# Patient Record
Sex: Male | Born: 1955 | Race: White | Hispanic: Refuse to answer | Marital: Single | Smoking: Never smoker
Health system: Southern US, Community
[De-identification: ages and names within clinical notes are randomized; demographics above are authoritative.]

## PROBLEM LIST (undated history)

## (undated) DIAGNOSIS — K56609 Unspecified intestinal obstruction, unspecified as to partial versus complete obstruction: Secondary | ICD-10-CM

## (undated) DIAGNOSIS — Z86711 Personal history of pulmonary embolism: Secondary | ICD-10-CM

## (undated) HISTORY — PX: TONSILLECTOMY: SUR1361

---

## 2008-06-07 DIAGNOSIS — M1990 Unspecified osteoarthritis, unspecified site: Secondary | ICD-10-CM | POA: Insufficient documentation

## 2010-06-07 HISTORY — PX: TOTAL KNEE ARTHROPLASTY: SHX125

## 2010-10-01 ENCOUNTER — Inpatient Hospital Stay: Payer: Self-pay | Admitting: Specialist

## 2010-10-01 ENCOUNTER — Ambulatory Visit: Payer: Self-pay | Admitting: General Practice

## 2017-05-27 ENCOUNTER — Other Ambulatory Visit: Payer: Self-pay

## 2017-05-27 ENCOUNTER — Ambulatory Visit: Payer: Medicaid Other | Attending: Physical Medicine and Rehabilitation | Admitting: Physical Therapy

## 2017-05-27 DIAGNOSIS — R29898 Other symptoms and signs involving the musculoskeletal system: Secondary | ICD-10-CM | POA: Diagnosis present

## 2017-05-27 DIAGNOSIS — R293 Abnormal posture: Secondary | ICD-10-CM

## 2017-05-27 DIAGNOSIS — G8929 Other chronic pain: Secondary | ICD-10-CM | POA: Diagnosis present

## 2017-05-27 DIAGNOSIS — M545 Low back pain: Secondary | ICD-10-CM | POA: Insufficient documentation

## 2017-05-27 NOTE — Patient Instructions (Signed)
Hamstring Step 2   Left foot relaxed, knee straight, other leg bent, foot flat. Raise straight leg further upward to maximal range. Hold _30__ seconds. Relax leg completely down. Repeat __3_ times.  Knee to Chest (Flexion)   Pull knee toward chest. Feel stretch in lower back or buttock area. Breathing deeply, Hold __15__ seconds. Repeat with other knee. Repeat _5___ times. Do __2__ sessions per day.  Bridging   Slowly raise buttocks from floor, keeping stomach tight. Repeat __15__ times per set. Do __2__ sets per session.   Lumbar Rotation (Non-Weight Bearing)   Feet on floor, slowly rock knees from side to side in small, pain-free range of motion. Allow lower back to rotate slightly. Repeat __15__ times per set. Do _2__ sets per session.  Strengthening: Straight Leg Raise (Phase 1)   Tighten muscles on front of right thigh, then lift leg __8__ inches from surface, keeping knee locked.  Repeat _15___ times per set.

## 2017-05-27 NOTE — Therapy (Addendum)
[facility] [address]  [place], [place], [address] Phone: [phone]   Fax:  [phone]  Physical Therapy Evaluation  Patient Details  Name: Ronnie Carey MRN: [id number] Date of Birth: Apr 20, 1956 Referring Provider: Dr. Suella Broad   Encounter Date: 05/27/2017  PT End of Session - 05/27/17 7425    Visit Number  1    Number of Visits  4    Date for PT Re-Evaluation  06/24/17    Authorization Type  Medicaid (submitted for approval)    PT Start Time  0833    PT Stop Time  0920    PT Time Calculation (min)  47 min    Activity Tolerance  Patient tolerated treatment well    Behavior During Therapy  [facility] for tasks assessed/performed        There were no vitals filed for this visit.   Subjective Assessment - 05/27/17 0834    Subjective  Patient reporting total body pain and he is "screwed up". reports a fall - 25 feet - when he was 36. 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 Spent 6 years in prison of which he reports caused pain. Now having sciatica of L leg only. Reports he had an xray and has no disc at L4-5; now has spasms of lower back. pain into L buttock and L leg. Takes 800 mg ibuprofen daily.     Pertinent History  R TKA    Diagnostic tests  xray - per patient - no disc at L4-5    Currently in Pain?  Yes    Pain Score  3     Pain Location  Back    Pain Orientation  Left    Pain Descriptors / Indicators  Shooting    Pain Type  Chronic pain    Pain Radiating Towards  into L buttock and L posterior L LE    Pain Onset  More than a month ago    Pain Frequency  Constant    Aggravating Factors   moving wrong, lifting, prolonged sitting and standing    Pain Relieving Factors  hanging/traction, stretching         [facility] PT Assessment - 05/27/17 0820      Assessment   Medical Diagnosis  DDD lumbar  disc; chronic pain syndrome    Referring Provider  Dr. Suella Broad    Next MD Visit  -- after PT evaluation    Prior Therapy  not for this issue      Precautions   Precautions  None      Restrictions   Weight Bearing Restrictions  No      Balance Screen   Has the patient fallen in the past 6 months  No    Has the patient had a decrease in activity level because of a fear of falling?   No    Is the patient reluctant to leave their home because of a fear of falling?   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      Cognition   Overall Cognitive Status  Within Functional Limits for tasks assessed      Observation/Other Assessments   Focus on Therapeutic Outcomes (FOTO)   --      Sensation   Light Touch  Appears Intact      Coordination   Gross Motor Movements are Fluid and Coordinated  Yes      Posture/Postural Control   Posture/Postural Control  Postural limitations    Postural Limitations  Rounded Shoulders;Forward head      ROM / Strength   AROM / PROM / Strength  AROM;Strength      AROM   AROM Assessment Site  Lumbar    Lumbar Flexion  fingertips to toes - reports cracking of spine    Lumbar Extension  25% limited - pain in L leg    Lumbar - Right Side Bend  fingertip to joint line - slight pain    Lumbar - Left Side Bend  fingertip above joint line - pain    Lumbar - Right Rotation  25% limited - slight pain    Lumbar - Left Rotation  40% limtied - painful      Strength   Overall Strength Comments  R LE gross strength 4+/5; L LE gross strength 4-/5    Strength Assessment Site  Hip;Knee      Flexibility   Soft Tissue Assessment /Muscle Length  yes    Hamstrings  B moderate tightness      Palpation   Palpation comment  TTP along lumbar paraspinals and L glute region              Objective measurements completed on examination: See above findings.              PT Education - 05/27/17 [phone]    Education provided  Yes    Education Details  exam findings, POC, HEP    Person(s) Educated  Patient    Methods  Explanation;Demonstration;Handout    Comprehension  Verbalized understanding;Returned demonstration          PT Long Term Goals - 05/27/17 0822      PT LONG TERM GOAL #1   Title  patient to be independent with advanced HEP    Status  New    Target Date  06/24/17      PT LONG TERM GOAL #2   Title  Patient to demonstrate full lumbar AROM to WNL without pain    Status  New    Target Date  06/24/17      PT LONG TERM GOAL #3   Title  Patient to demonstrate strength of L LE to >/= 4+/5    Status  New    Target Date  06/24/17      PT LONG TERM GOAL #4   Title  patient to demosntrate good posture and body mechanics as it relates to his daily activities    Status  New    Target Date  06/24/17             Plan - 05/27/17 1200    Clinical Impression Statement  Mr. Galentine is a 61 y/o male presenting to [facility] today regarding primary complaints of low back pain that has been present for quite some time. Patient with subjective reports of sciatica type symptoms, however unable to reproduce any of these signs in session today. Patient reporting prior x-ray of lumbar spine with possible "forward shift" of spine per patient, however not in medical chart. Patient today with B HS tightness, slight weakness  of L proximal hip as well as TTP alung lumbar paraspinals. HEP intitiated today with good carryover. Did discuss advising patient to follow up with MD prior to scheduling further visits to confirm/rule out skeletal instability prior to further treatment. Patient to benefit from skilled PT services to address pain and functional mobility limitations.     Clinical Presentation  Stable    Clinical Decision Making  Low    Rehab Potential   Good    PT Frequency  1x / week    PT Duration  3 weeks    PT Treatment/Interventions  ADLs/Self Care Home Management;Cryotherapy;Electrical Stimulation;Iontophoresis 75m/ml Dexamethasone;Moist Heat;Traction;Therapeutic exercise;Therapeutic activities;Functional mobility training;Ultrasound;Passive range of motion;Dry needling;Manual techniques;Vasopneumatic Device;Taping;Patient/family education;Neuromuscular re-education    Consulted and Agree with Plan of Care  Patient       Patient will benefit from skilled therapeutic intervention in order to improve the following deficits and impairments:  Abnormal gait, Pain, Decreased strength, Decreased activity tolerance, Difficulty walking  Visit Diagnosis: Chronic bilateral low back pain, with sciatica presence unspecified  Abnormal posture  Other symptoms and signs involving the musculoskeletal system     Problem List There are no active problems to display for this patient.    SLanney Gins PT, DPT 05/27/17 12:25 PM  PHYSICAL THERAPY DISCHARGE SUMMARY  Visits from Start of Care: 1  Current functional level related to goals / functional outcomes: See above; did not call back for scheduling   Remaining deficits: See above   Education / Equipment: HEP  Plan: Patient agrees to discharge.  Patient goals were not met. Patient is being discharged due to not returning since the last visit.  ?????    SLanney Gins PT, DPT 07/19/17 10:09 AM   [facility] [place] [address]Phone: [phone]  Fax:  [phone] Name: Ronnie TavanoMRN: [id number] of Birth: [birth year]

## 2018-05-07 DIAGNOSIS — K56609 Unspecified intestinal obstruction, unspecified as to partial versus complete obstruction: Secondary | ICD-10-CM

## 2018-05-07 HISTORY — DX: Unspecified intestinal obstruction, unspecified as to partial versus complete obstruction: K56.609

## 2018-05-09 ENCOUNTER — Inpatient Hospital Stay (HOSPITAL_COMMUNITY)
Admission: EM | Admit: 2018-05-09 | Discharge: 2018-05-12 | DRG: 390 | Disposition: A | Discharge status: Home / Self Care | Payer: Medicaid Other | Attending: General Surgery | Admitting: General Surgery

## 2018-05-09 ENCOUNTER — Encounter (HOSPITAL_COMMUNITY): Payer: Self-pay | Admitting: Emergency Medicine

## 2018-05-09 DIAGNOSIS — Z4659 Encounter for fitting and adjustment of other gastrointestinal appliance and device: Secondary | ICD-10-CM

## 2018-05-09 DIAGNOSIS — K56609 Unspecified intestinal obstruction, unspecified as to partial versus complete obstruction: Secondary | ICD-10-CM

## 2018-05-09 DIAGNOSIS — K429 Umbilical hernia without obstruction or gangrene: Secondary | ICD-10-CM | POA: Diagnosis present

## 2018-05-09 DIAGNOSIS — K56600 Partial intestinal obstruction, unspecified as to cause: Principal | ICD-10-CM | POA: Diagnosis present

## 2018-05-09 DIAGNOSIS — Z96659 Presence of unspecified artificial knee joint: Secondary | ICD-10-CM | POA: Diagnosis present

## 2018-05-09 HISTORY — DX: Personal history of pulmonary embolism: Z86.711

## 2018-05-09 HISTORY — DX: Unspecified intestinal obstruction, unspecified as to partial versus complete obstruction: K56.609

## 2018-05-09 LAB — CBC
HCT: 43 % (ref 39.0–52.0)
Hemoglobin: 14.5 g/dL (ref 13.0–17.0)
MCH: 30.5 pg (ref 26.0–34.0)
MCHC: 33.7 g/dL (ref 30.0–36.0)
MCV: 90.3 fL (ref 80.0–100.0)
Platelets: 320 10*3/uL (ref 150–400)
RBC: 4.76 MIL/uL (ref 4.22–5.81)
RDW: 12.4 % (ref 11.5–15.5)
WBC: 7.8 10*3/uL (ref 4.0–10.5)
nRBC: 0 % (ref 0.0–0.2)

## 2018-05-09 LAB — COMPREHENSIVE METABOLIC PANEL
ALT: 34 U/L (ref 0–44)
AST: 25 U/L (ref 15–41)
Albumin: 4.2 g/dL (ref 3.5–5.0)
Alkaline Phosphatase: 66 U/L (ref 38–126)
Anion gap: 11 (ref 5–15)
BUN: 13 mg/dL (ref 8–23)
CO2: 22 mmol/L (ref 22–32)
Calcium: 9.2 mg/dL (ref 8.9–10.3)
Chloride: 103 mmol/L (ref 98–111)
Creatinine, Ser: 1.1 mg/dL (ref 0.61–1.24)
GFR calc Af Amer: >60 mL/min (ref >60)
GFR calc non Af Amer: >60 mL/min (ref >60)
Glucose, Bld: 146 mg/dL — ABNORMAL HIGH (ref 70–99)
Potassium: 3.5 mmol/L (ref 3.5–5.1)
Sodium: 136 mmol/L (ref 135–145)
Total Bilirubin: 0.8 mg/dL (ref 0.3–1.2)
Total Protein: 7.2 g/dL (ref 6.5–8.1)

## 2018-05-09 LAB — URINALYSIS, ROUTINE W REFLEX MICROSCOPIC
Bilirubin Urine: NEGATIVE
Glucose, UA: NEGATIVE mg/dL
Hgb urine dipstick: NEGATIVE
Ketones, ur: 5 mg/dL — AB
Leukocytes, UA: NEGATIVE
Nitrite: NEGATIVE
Protein, ur: NEGATIVE mg/dL
Specific Gravity, Urine: 1.012 (ref 1.005–1.030)
pH: 5 (ref 5.0–8.0)

## 2018-05-09 LAB — LIPASE, BLOOD: Lipase: 28 U/L (ref 11–51)

## 2018-05-09 NOTE — ED Triage Notes (Signed)
Pt reports abdominal pain accompanied w/ swelling, bloating, gas, nausea and vomiting.

## 2018-05-10 ENCOUNTER — Inpatient Hospital Stay (HOSPITAL_COMMUNITY): Payer: Medicaid Other

## 2018-05-10 ENCOUNTER — Emergency Department (HOSPITAL_COMMUNITY): Payer: Medicaid Other

## 2018-05-10 ENCOUNTER — Encounter (HOSPITAL_COMMUNITY): Payer: Self-pay | Admitting: General Practice

## 2018-05-10 DIAGNOSIS — K56600 Partial intestinal obstruction, unspecified as to cause: Secondary | ICD-10-CM | POA: Diagnosis present

## 2018-05-10 DIAGNOSIS — K429 Umbilical hernia without obstruction or gangrene: Secondary | ICD-10-CM | POA: Diagnosis present

## 2018-05-10 DIAGNOSIS — Z96659 Presence of unspecified artificial knee joint: Secondary | ICD-10-CM | POA: Diagnosis present

## 2018-05-10 DIAGNOSIS — K56609 Unspecified intestinal obstruction, unspecified as to partial versus complete obstruction: Secondary | ICD-10-CM | POA: Diagnosis present

## 2018-05-10 LAB — CBC
HCT: 44.8 % (ref 39.0–52.0)
Hemoglobin: 14.9 g/dL (ref 13.0–17.0)
MCH: 30.2 pg (ref 26.0–34.0)
MCHC: 33.3 g/dL (ref 30.0–36.0)
MCV: 90.7 fL (ref 80.0–100.0)
Platelets: 336 10*3/uL (ref 150–400)
RBC: 4.94 MIL/uL (ref 4.22–5.81)
RDW: 12.6 % (ref 11.5–15.5)
WBC: 8.8 10*3/uL (ref 4.0–10.5)
nRBC: 0 % (ref 0.0–0.2)

## 2018-05-10 LAB — BASIC METABOLIC PANEL
Anion gap: 12 (ref 5–15)
BUN: 12 mg/dL (ref 8–23)
CO2: 24 mmol/L (ref 22–32)
Calcium: 9.2 mg/dL (ref 8.9–10.3)
Chloride: 101 mmol/L (ref 98–111)
Creatinine, Ser: 1.18 mg/dL (ref 0.61–1.24)
GFR calc Af Amer: >60 mL/min (ref >60)
GFR calc non Af Amer: >60 mL/min (ref >60)
Glucose, Bld: 116 mg/dL — ABNORMAL HIGH (ref 70–99)
Potassium: 3.6 mmol/L (ref 3.5–5.1)
Sodium: 137 mmol/L (ref 135–145)

## 2018-05-10 MED ORDER — ACETAMINOPHEN 325 MG PO TABS
650.0000 mg | ORAL_TABLET | Freq: Four times a day (QID) | ORAL | Status: DC | PRN
  Administered 2018-05-10: 650 mg via ORAL
  Filled 2018-05-10: qty 2

## 2018-05-10 MED ORDER — MENTHOL 3 MG MT LOZG
1.0000 | LOZENGE | OROMUCOSAL | Status: DC | PRN
  Administered 2018-05-10: 3 mg via ORAL
  Filled 2018-05-10: qty 9

## 2018-05-10 MED ORDER — LORAZEPAM 2 MG/ML IJ SOLN
1.0000 mg | Freq: Once | INTRAMUSCULAR | Status: AC
  Administered 2018-05-10: 1 mg via INTRAVENOUS
  Filled 2018-05-10: qty 1

## 2018-05-10 MED ORDER — ONDANSETRON 4 MG PO TBDP: 4.0000 mg | ORAL_TABLET | Freq: Four times a day (QID) | ORAL | Status: DC | PRN

## 2018-05-10 MED ORDER — ENOXAPARIN SODIUM 40 MG/0.4ML ~~LOC~~ SOLN
40.0000 mg | SUBCUTANEOUS | Status: DC
  Administered 2018-05-10: 40 mg via SUBCUTANEOUS
  Filled 2018-05-10 (×2): qty 0.4

## 2018-05-10 MED ORDER — SODIUM CHLORIDE 0.9 % IV SOLN
INTRAVENOUS | Status: DC
  Administered 2018-05-10 – 2018-05-12 (×4): via INTRAVENOUS

## 2018-05-10 MED ORDER — DIATRIZOATE MEGLUMINE & SODIUM 66-10 % PO SOLN
90.0000 mL | Freq: Once | ORAL | Status: AC
  Administered 2018-05-10: 90 mL via NASOGASTRIC
  Filled 2018-05-10: qty 90

## 2018-05-10 MED ORDER — ONDANSETRON HCL 4 MG/2ML IJ SOLN: 4.0000 mg | Freq: Four times a day (QID) | INTRAMUSCULAR | Status: DC | PRN

## 2018-05-10 MED ORDER — LIDOCAINE HCL URETHRAL/MUCOSAL 2 % EX GEL
1.0000 "application " | Freq: Once | CUTANEOUS | Status: AC
  Administered 2018-05-10: 1 via TOPICAL
  Filled 2018-05-10: qty 20

## 2018-05-10 MED ORDER — MORPHINE SULFATE (PF) 2 MG/ML IV SOLN
2.0000 mg | INTRAVENOUS | Status: DC | PRN
  Filled 2018-05-10: qty 1

## 2018-05-10 MED ORDER — ACETAMINOPHEN 650 MG RE SUPP: 650.0000 mg | Freq: Four times a day (QID) | RECTAL | Status: DC | PRN

## 2018-05-10 MED ORDER — HYDRALAZINE HCL 20 MG/ML IJ SOLN: 10.0000 mg | INTRAMUSCULAR | Status: DC | PRN

## 2018-05-10 MED ORDER — IOHEXOL 300 MG/ML  SOLN
100.0000 mL | Freq: Once | INTRAMUSCULAR | Status: AC | PRN
  Administered 2018-05-10: 100 mL via INTRAVENOUS

## 2018-05-10 NOTE — ED Notes (Signed)
Attempted report 

## 2018-05-10 NOTE — ED Provider Notes (Signed)
[facility] EMERGENCY DEPARTMENT Provider Note   CSN: [id number] Arrival date & time: 05/09/18  2132     History   Chief Complaint Chief Complaint  Patient presents with  . Abdominal Pain  . Emesis    HPI Ronnie Carey is a 62 y.o. male.  The history is provided by the patient and medical records.  Abdominal Pain   Associated symptoms include nausea and vomiting.  Emesis   Associated symptoms include abdominal pain.     62 y.o. M with hx of umbilical hernia for the past 15 years, presenting to the ED with abdominal pain.  Reports he noticed this start about 2 days ago.  States it was mild, mostly just a little nausea and discomfort around his hernia.  States he did notice that it seemed a little larger than normal and he had to "push it back in" several times which he does not generally have to do.  Yesterday he had worsening nausea had a few episodes of vomiting.  States appetite has been poor.  States his bowel movements have also been loose and very small in caliber which is abnormal for him.  Overall he feels bloated and distended.  He has been able to pass a little gas and belch a few times.  States his hernia has become increasingly more sore.  States he also has been having subjective fever and some chills at home.  He denies any urinary symptoms.  Is not tried any medications for his symptoms at home.  No prior abdominal surgeries.  History reviewed. No pertinent past medical history.  There are no active problems to display for this patient.   History reviewed. No pertinent surgical history.      Home Medications    Prior to Admission medications   Not on File    Family History No family history on file.  Social History Social History   Tobacco Use  . Smoking status: Not on file  Substance Use Topics  . Alcohol use: Not on file  . Drug use: Not on file     Allergies   Patient has no known allergies.   Review of Systems Review  of Systems  Gastrointestinal: Positive for abdominal pain, nausea and vomiting.  All other systems reviewed and are negative.    Physical Exam Updated Vital Signs BP 128/81   Pulse 70   Temp 98.3 F (36.8 C) (Oral)   Resp 16   Ht 5\' 11"  (1.803 m)   Wt 106.6 kg   SpO2 96%   BMI 32.78 kg/m   Physical Exam  Constitutional: He is oriented to person, place, and time. He appears well-developed and well-nourished.  HENT:  Head: Normocephalic and atraumatic.  Mouth/Throat: Oropharynx is clear and moist.  Eyes: Pupils are equal, round, and reactive to light. Conjunctivae and EOM are normal.  Neck: Normal range of motion.  Cardiovascular: Normal rate, regular rhythm and normal heart sounds.  Pulmonary/Chest: Effort normal and breath sounds normal.  Abdominal: Soft. He exhibits distension. Bowel sounds are decreased. There is tenderness. There is no rigidity and no guarding.  Well healed midline surgical incision; Umbilical hernia present, partially reducible, skin overlying appears mildly erythematous without cellulitic changes; abdomen appears somewhat distended, bowel sounds decreased  Musculoskeletal: Normal range of motion.  Neurological: He is alert and oriented to person, place, and time.  Skin: Skin is warm and dry.  Psychiatric: He has a normal mood and affect.  Nursing note and vitals reviewed.  ED Treatments / Results  Labs (all labs ordered are listed, but only abnormal results are displayed) Labs Reviewed  COMPREHENSIVE METABOLIC PANEL - Abnormal; Notable for the following components:      Result Value   Glucose, Bld 146 (*)    All other components within normal limits  URINALYSIS, ROUTINE W REFLEX MICROSCOPIC - Abnormal; Notable for the following components:   APPearance HAZY (*)    Ketones, ur 5 (*)    All other components within normal limits  LIPASE, BLOOD  CBC    EKG None  Radiology Ct Abdomen Pelvis W Contrast  Result Date: 05/10/2018 CLINICAL  DATA:  Abdominal pain and distension. Enlarging umbilical hernia. EXAM: CT ABDOMEN AND PELVIS WITH CONTRAST TECHNIQUE: Multidetector CT imaging of the abdomen and pelvis was performed using the standard protocol following bolus administration of intravenous contrast. CONTRAST:  100mL OMNIPAQUE IOHEXOL 300 MG/ML  SOLN COMPARISON:  None. FINDINGS: LOWER CHEST: Minimal RIGHT lung base atelectasis. Included heart size is normal. No pericardial effusion. HEPATOBILIARY: 18 mm benign-appearing cyst RIGHT lobe of the liver, otherwise unremarkable. Normal gallbladder. PANCREAS: Normal. SPLEEN: Normal. ADRENALS/URINARY TRACT: Kidneys are orthotopic, demonstrating symmetric enhancement. No nephrolithiasis, hydronephrosis or solid renal masses. 4.5 cm homogeneously hypodense benign-appearing cyst upper pole LEFT kidney. The unopacified ureters are normal in course and caliber. Delayed imaging through the kidneys demonstrates symmetric prompt contrast excretion within the proximal urinary collecting system. Urinary bladder is partially distended and unremarkable. Normal adrenal glands. STOMACH/BOWEL: Dilated small bowel to 4 cm with LEFT mid abdomen transition point, no discrete mass (axial image 48). Mild fluid distended stomach. The large bowel are normal in course and caliber without inflammatory changes. Mild colonic diverticulosis. Normal appendix. VASCULAR/LYMPHATIC: Aortoiliac vessels are normal in course and caliber. Mild calcific atherosclerosis. No lymphadenopathy by CT size criteria. REPRODUCTIVE: Normal. OTHER: Small volume intraperitoneal free fluid. Mild mesenteric edema. Small fat containing umbilical hernias. Moderate fat containing umbilical hernia with minimal free fluid. MUSCULOSKELETAL: Nonacute. Grade 1 L4-5 anterolisthesis with chronic bilateral L4 pars interarticularis defects. Severe L4-5 neural foraminal narrowing. IMPRESSION: 1. Early versus partial obstruction, transition point LEFT mid abdomen,  possibly from adhesions. 2. Moderate fat containing umbilical hernia with minimal free fluid, no bowel within hernia sac. Aortic Atherosclerosis (ICD10-I70.0). Electronically Signed   By: Awilda Metroourtnay  Bloomer M.D.   On: 05/10/2018 02:39    Procedures Procedures (including critical care time)  Medications Ordered in ED Medications - No data to display   Initial Impression / Assessment and Plan / ED Course  I have reviewed the triage vital signs and the nursing notes.  Pertinent labs & imaging results that were available during my care of the patient were reviewed by me and considered in my medical decision making (see chart for details).  62 year old male here with abdominal pain.  Reports this began about 2 days ago and has been worsening.  He does report some nausea and vomiting as well as decreased appetite.  Has also noticed that his umbilical hernia seems "larger" than normal.  Still remains reducible.  He is afebrile and nontoxic.  Abdomen does appear somewhat distended and has decreased bowel sounds.  Umbilical hernia is partially reducible here, some mild erythema of the overlying skin but no overt signs of strangulation.  Given symptoms, concern for complications with hernia versus SBO.  CT scan was obtained revealing small bowel obstruction with transition point in the left midabdomen.  Fat-containing umbilical hernia without complication.  NG tube to be placed.  General surgery consulted--  Dr. Dwain Sarna has evaluated in the ED and will admit for ongoing care.  Final Clinical Impressions(s) / ED Diagnoses   Final diagnoses:  SBO (small bowel obstruction) [facility])    ED Discharge Orders    None       Garlon Hatchet, PA-C 05/10/18 0450    Gilda Crease, MD 05/10/18 [phone]

## 2018-05-10 NOTE — ED Notes (Signed)
Report called  

## 2018-05-10 NOTE — H&P (Signed)
Ronnie Carey is an 62 y.o. male.   Chief Complaint: abd pain HPI: [age] yom who is otherwise healthy presents with 48 hours of increasing abdominal pain, n/v.  This was not getting better and came to er.  He has never had before. Has known uh for years that is unchanged. No prior abdominal surgery. No history csc.  Is passing some flatus but not able to tolerate any po for 2 days.  He underwent ct scan that shows a fat containing uh and likely sbo.  The small bowel is dilated to 4 cm and there is small amount free fluid.  I was asked to see him   History reviewed. No pertinent past medical history.  Knee arthroplasty, t/a  Nonsmoker  Allergies: No Known Allergies  meds prn ibuprofen  Results for orders placed or performed during the hospital encounter of 05/09/18 (from the past 48 hour(s))  Urinalysis, Routine w reflex microscopic     Status: Abnormal   Collection Time: 05/09/18 10:25 PM  Result Value Ref Range   Color, Urine YELLOW YELLOW   APPearance HAZY (A) CLEAR   Specific Gravity, Urine 1.012 1.005 - 1.030   pH 5.0 5.0 - 8.0   Glucose, UA NEGATIVE NEGATIVE mg/dL   Hgb urine dipstick NEGATIVE NEGATIVE   Bilirubin Urine NEGATIVE NEGATIVE   Ketones, ur 5 (A) NEGATIVE mg/dL   Protein, ur NEGATIVE NEGATIVE mg/dL   Nitrite NEGATIVE NEGATIVE   Leukocytes, UA NEGATIVE NEGATIVE    Comment: Performed at [facility], [address] [place], [place] [address]  Lipase, blood     Status: None   Collection Time: 05/09/18 10:31 PM  Result Value Ref Range   Lipase 28 11 - 51 U/L    Comment: Performed at [facility], [address] [place], [place] [address]  Comprehensive metabolic panel     Status: Abnormal   Collection Time: 05/09/18 10:31 PM  Result Value Ref Range   Sodium 136 135 - 145 mmol/L   Potassium 3.5 3.5 - 5.1 mmol/L   Chloride 103 98 - 111 mmol/L   CO2 22 22 - 32 mmol/L   Glucose, Bld 146 (H) 70 - 99 mg/dL   BUN 13 8 - 23 mg/dL   Creatinine, Ser 6.641.10 0.61  - 1.24 mg/dL   Calcium 9.2 8.9 - 40.310.3 mg/dL   Total Protein 7.2 6.5 - 8.1 g/dL   Albumin 4.2 3.5 - 5.0 g/dL   AST 25 15 - 41 U/L   ALT 34 0 - 44 U/L   Alkaline Phosphatase 66 38 - 126 U/L   Total Bilirubin 0.8 0.3 - 1.2 mg/dL   GFR calc non Af Amer >60 >60 mL/min   GFR calc Af Amer >60 >60 mL/min   Anion gap 11 5 - 15    Comment: Performed at [facility], [address] [place], [place] [address]  CBC     Status: None   Collection Time: 05/09/18 10:31 PM  Result Value Ref Range   WBC 7.8 4.0 - 10.5 K/uL   RBC 4.76 4.22 - 5.81 MIL/uL   Hemoglobin 14.5 13.0 - 17.0 g/dL   HCT 95.643.0 38.739.0 - 56.452.0 %   MCV 90.3 80.0 - 100.0 fL   MCH 30.5 26.0 - 34.0 pg   MCHC 33.7 30.0 - 36.0 g/dL   RDW 33.212.4 95.111.5 - 88.415.5 %   Platelets 320 150 - 400 K/uL   nRBC 0.0 0.0 - 0.2 %  Comment: Performed at [facility], [address] [place], [place] [address]   Ct Abdomen Pelvis W Contrast  Result Date: 05/10/2018 CLINICAL DATA:  Abdominal pain and distension. Enlarging umbilical hernia. EXAM: CT ABDOMEN AND PELVIS WITH CONTRAST TECHNIQUE: Multidetector CT imaging of the abdomen and pelvis was performed using the standard protocol following bolus administration of intravenous contrast. CONTRAST:  OMNIPAQUE IOHEXOL 300 MG/ML  SOLN COMPARISON:  None. FINDINGS: LOWER CHEST: Minimal RIGHT lung base atelectasis. Included heart size is normal. No pericardial effusion. HEPATOBILIARY: 18 mm benign-appearing cyst RIGHT lobe of the liver, otherwise unremarkable. Normal gallbladder. PANCREAS: Normal. SPLEEN: Normal. ADRENALS/URINARY TRACT: Kidneys are orthotopic, demonstrating symmetric enhancement. No nephrolithiasis, hydronephrosis or solid renal masses. 4.5 cm homogeneously hypodense benign-appearing cyst upper pole LEFT kidney. The unopacified ureters are normal in course and caliber. Delayed imaging through the kidneys demonstrates symmetric prompt contrast excretion within the proximal urinary  collecting system. Urinary bladder is partially distended and unremarkable. Normal adrenal glands. STOMACH/BOWEL: Dilated small bowel to 4 cm with LEFT mid abdomen transition point, no discrete mass (axial image 48). Mild fluid distended stomach. The large bowel are normal in course and caliber without inflammatory changes. Mild colonic diverticulosis. Normal appendix. VASCULAR/LYMPHATIC: Aortoiliac vessels are normal in course and caliber. Mild calcific atherosclerosis. No lymphadenopathy by CT size criteria. REPRODUCTIVE: Normal. OTHER: Small volume intraperitoneal free fluid. Mild mesenteric edema. Small fat containing umbilical hernias. Moderate fat containing umbilical hernia with minimal free fluid. MUSCULOSKELETAL: Nonacute. Grade 1 L4-5 anterolisthesis with chronic bilateral L4 pars interarticularis defects. Severe L4-5 neural foraminal narrowing. IMPRESSION: 1. Early versus partial obstruction, transition point LEFT mid abdomen, possibly from adhesions. 2. Moderate fat containing umbilical hernia with minimal free fluid, no bowel within hernia sac. Aortic Atherosclerosis (ICD10-I70.0). Electronically Signed   By: Awilda Metro M.D.   On: 05/10/2018 02:39    Review of Systems  Gastrointestinal: Positive for abdominal pain, nausea and vomiting.  Musculoskeletal: Positive for joint pain.    Blood pressure 114/77, pulse 76, temperature 98.3 F (36.8 C), temperature source Oral, resp. rate 16, height 5\' 11"  (1.803 m), weight 106.6 kg, SpO2 96 %. Physical Exam  Vitals reviewed. Constitutional: He appears well-developed and well-nourished.  HENT:  Head: Normocephalic and atraumatic.  Right Ear: External ear normal.  Left Ear: External ear normal.  Eyes: Pupils are equal, round, and reactive to light. No scleral icterus.  Neck: Neck supple.  Cardiovascular: Normal rate, regular rhythm and normal heart sounds.  Respiratory: Effort normal and breath sounds normal. He has no wheezes.  GI: He  exhibits distension (mild). Bowel sounds are decreased. There is no tenderness. A hernia (uh tender) is present.     Assessment/Plan SBO I dont think this is related to uh. This appears to be just fat containing and he states it is at baseline although a little more tender due to multiple attempts to reduce it.  He does not need surgery at this time based on exam.  However without prior surgery he certainly has lower threshold for surgery. He feels a little better after ng and has already drained one cannister.  I think reasonable to give course of nonoperative mgt with ng drainage and small bowel protocol. We discussed indications for surgery as well.   Emelia Loron, MD 05/10/2018, 4:19 AM

## 2018-05-10 NOTE — Progress Notes (Signed)
Pt stated he is feeling better. He is having flatus. Abdominal pain and bloating is improved. He states his pain is more of a soreness.   PE:  General: Well appearing, NAD HEENT: NGT in place Abdomen: soft, not distended, + BS, mild generalized TTP without guarding, no peritonitis  SBO: - 8 hr delay film later today. Hopefully pt will not need surgery  Mattie MarlinJessica Colbie Danner, [facility] Pager [phone]

## 2018-05-11 ENCOUNTER — Inpatient Hospital Stay (HOSPITAL_COMMUNITY): Payer: Medicaid Other

## 2018-05-11 ENCOUNTER — Encounter (HOSPITAL_COMMUNITY): Payer: Self-pay | Admitting: General Practice

## 2018-05-11 ENCOUNTER — Other Ambulatory Visit: Payer: Self-pay

## 2018-05-11 NOTE — Progress Notes (Signed)
[facility] Progress Note     Subjective: CC: sbo Wants NGT out and wants to eat. Passing flatus, no BM yet. Denies pain. Distention improved.   Objective: Vital signs in last 24 hours: Temp:  [98 F (36.7 C)-98.7 F (37.1 C)] 98.2 F (36.8 C) (12/05 0554) Pulse Rate:  [66-75] 66 (12/05 0554) Resp:  [18-19] 18 (12/05 0554) BP: (115-134)/(65-75) 126/65 (12/05 0554) SpO2:  [96 %-97 %] 96 % (12/05 0554) Last BM Date: 05/08/18  Intake/Output from previous day: 12/04 0701 - 12/05 0700 In: 1839.9 [P.O.:120; I.V.:1719.9] Out: 1550 [Urine:300; Emesis/NG output:1250] Intake/Output this shift: Total I/O In: -  Out: 300 [Urine:300]  PE: Gen:  Alert, NAD, pleasant Card:  Regular rate and rhythm, pedal pulses 2+ BL Pulm:  Normal effort, clear to auscultation bilaterally Abd: Soft, non-tender, non-distended, bowel sounds present, no HSM Skin: warm and dry, no rashes  Psych: A&Ox3   Lab Results:  Recent Labs    05/09/18 2231 05/10/18 0537  WBC 7.8 8.8  HGB 14.5 14.9  HCT 43.0 44.8  PLT 320 336   BMET Recent Labs    05/09/18 2231 05/10/18 0537  NA 136 137  K 3.5 3.6  CL 103 101  CO2 22 24  GLUCOSE 146* 116*  BUN 13 12  CREATININE 1.10 1.18  CALCIUM 9.2 9.2   PT/INR No results for input(s): LABPROT, INR in the last 72 hours. CMP     Component Value Date/Time   NA 137 05/10/2018 0537   K 3.6 05/10/2018 0537   CL 101 05/10/2018 0537   CO2 24 05/10/2018 0537   GLUCOSE 116 (H) 05/10/2018 0537   BUN 12 05/10/2018 0537   CREATININE 1.18 05/10/2018 0537   CALCIUM 9.2 05/10/2018 0537   PROT 7.2 05/09/2018 2231   ALBUMIN 4.2 05/09/2018 2231   AST 25 05/09/2018 2231   ALT 34 05/09/2018 2231   ALKPHOS 66 05/09/2018 2231   BILITOT 0.8 05/09/2018 2231   GFRNONAA >60 05/10/2018 0537   GFRAA >60 05/10/2018 0537   Lipase     Component Value Date/Time   LIPASE 28 05/09/2018 2231       Studies/Results: Ct Abdomen Pelvis W Contrast  Result Date:  05/10/2018 CLINICAL DATA:  Abdominal pain and distension. Enlarging umbilical hernia. EXAM: CT ABDOMEN AND PELVIS WITH CONTRAST TECHNIQUE: Multidetector CT imaging of the abdomen and pelvis was performed using the standard protocol following bolus administration of intravenous contrast. CONTRAST:  100mL OMNIPAQUE IOHEXOL 300 MG/ML  SOLN COMPARISON:  None. FINDINGS: LOWER CHEST: Minimal RIGHT lung base atelectasis. Included heart size is normal. No pericardial effusion. HEPATOBILIARY: 18 mm benign-appearing cyst RIGHT lobe of the liver, otherwise unremarkable. Normal gallbladder. PANCREAS: Normal. SPLEEN: Normal. ADRENALS/URINARY TRACT: Kidneys are orthotopic, demonstrating symmetric enhancement. No nephrolithiasis, hydronephrosis or solid renal masses. 4.5 cm homogeneously hypodense benign-appearing cyst upper pole LEFT kidney. The unopacified ureters are normal in course and caliber. Delayed imaging through the kidneys demonstrates symmetric prompt contrast excretion within the proximal urinary collecting system. Urinary bladder is partially distended and unremarkable. Normal adrenal glands. STOMACH/BOWEL: Dilated small bowel to 4 cm with LEFT mid abdomen transition point, no discrete mass (axial image 48). Mild fluid distended stomach. The large bowel are normal in course and caliber without inflammatory changes. Mild colonic diverticulosis. Normal appendix. VASCULAR/LYMPHATIC: Aortoiliac vessels are normal in course and caliber. Mild calcific atherosclerosis. No lymphadenopathy by CT size criteria. REPRODUCTIVE: Normal. OTHER: Small volume intraperitoneal free fluid. Mild mesenteric edema. Small fat containing umbilical hernias.  Moderate fat containing umbilical hernia with minimal free fluid. MUSCULOSKELETAL: Nonacute. Grade 1 L4-5 anterolisthesis with chronic bilateral L4 pars interarticularis defects. Severe L4-5 neural foraminal narrowing. IMPRESSION: 1. Early versus partial obstruction, transition point LEFT  mid abdomen, possibly from adhesions. 2. Moderate fat containing umbilical hernia with minimal free fluid, no bowel within hernia sac. Aortic Atherosclerosis (ICD10-I70.0). Electronically Signed   By: Awilda Metro M.D.   On: 05/10/2018 02:39   Dg Abd Portable 1v-small Bowel Obstruction Protocol-initial, 8 Hr Delay  Result Date: 05/10/2018 CLINICAL DATA:  Small bowel obstruction, 8hr film. EXAM: PORTABLE ABDOMEN - 1 VIEW COMPARISON:  CT abdomen from earlier same day. FINDINGS: Dilated gas-filled bowel loops are again seen within the and central abdomen. Oral contrast is seen within the small bowel of the RIGHT abdomen, within distended and relatively decompressed loops suggesting partial obstruction. Contrast within the bladder. No evidence of abnormal fluid collection. No evidence of free intraperitoneal air, although the upper portions of the abdomen are excluded on this exam. IMPRESSION: Findings consistent with partial small bowel obstruction. Recommend additional follow-up plain film examination to ensure passage of oral contrast to the colon. Electronically Signed   By: Bary Richard M.D.   On: 05/10/2018 13:15   Dg Abd Portable 1v  Result Date: 05/10/2018 CLINICAL DATA:  Nasogastric tube placement. Small bowel obstruction. EXAM: PORTABLE ABDOMEN - 1 VIEW COMPARISON:  CT abdomen and pelvis May 10, 2018 FINDINGS: Nasogastric tube tip projects in mid stomach, side port projecting distal to GE junction. Contrast in the urinary collecting system. Multiple loops of mild gas distended small bowel. Soft tissue planes and included osseous structures are unchanged. IMPRESSION: Nasogastric tip projecting in mid stomach. Electronically Signed   By: Awilda Metro M.D.   On: 05/10/2018 04:41    Anti-infectives: Anti-infectives (From admission, onward)   None       Assessment/Plan SBO - repeat film with contrast in colon - d/c NGT and start CLD - may be able to advance later today -  mobilize as tolerated    LOS: 1 day    Wells Guiles , [facility] 05/11/2018, 11:04 AM Pager: [phone] Consults: [phone] Mon-Fri 7:00 am-4:30 pm Sat-Sun 7:00 am-11:30 am

## 2018-05-12 MED ORDER — DOCUSATE SODIUM 100 MG PO CAPS: 100.0000 mg | ORAL_CAPSULE | Freq: Two times a day (BID) | ORAL | 0 refills | Status: DC

## 2018-05-12 MED ORDER — DOCUSATE SODIUM 100 MG PO CAPS: 100.0000 mg | ORAL_CAPSULE | Freq: Two times a day (BID) | ORAL | Status: DC

## 2018-05-12 MED ORDER — POLYETHYLENE GLYCOL 3350 17 G PO PACK: 17.0000 g | PACK | Freq: Every day | ORAL | 0 refills | Status: DC | PRN

## 2018-05-12 MED ORDER — POLYETHYLENE GLYCOL 3350 17 G PO PACK: 17.0000 g | PACK | Freq: Every day | ORAL | Status: DC

## 2018-05-12 NOTE — Discharge Summary (Signed)
[facility] Discharge Summary   Patient ID: Ronnie Carey MRN: [id number] DOB/AGE: 01/28/1956 62 y.o.  Admit date: 05/09/2018 Discharge date: 05/12/2018  Admitting Diagnosis: SBO  Discharge Diagnosis Patient Active Problem List   Diagnosis Date Noted  . SBO (small bowel obstruction) (HCC) 05/10/2018    Consultants None  Imaging: Dg Abd Portable 1v  Result Date: 05/11/2018 CLINICAL DATA:  Small-bowel obstruction. EXAM: PORTABLE ABDOMEN - 1 VIEW COMPARISON:  05/10/2018. FINDINGS: NG tube noted with tip in the distal stomach/proximal duodenum. Multiple dilated loops of small bowel again noted. Contrast noted throughout the colon. Findings again consistent with partial small bowel obstruction. Contrast in the bladder. Degenerative change lumbar spine and both hips. IMPRESSION: NG tube noted with tip over the distal stomach/proximal duodenum. 2. Persistent dilated loops of small bowel. Contrast in the colon. Findings again consistent with partial small bowel obstruction. Electronically Signed   By: Maisie Fus  Register   On: 05/11/2018 12:30   Dg Abd Portable 1v-small Bowel Obstruction Protocol-initial, 8 Hr Delay  Result Date: 05/10/2018 CLINICAL DATA:  Small bowel obstruction, 8hr film. EXAM: PORTABLE ABDOMEN - 1 VIEW COMPARISON:  CT abdomen from earlier same day. FINDINGS: Dilated gas-filled bowel loops are again seen within the and central abdomen. Oral contrast is seen within the small bowel of the RIGHT abdomen, within distended and relatively decompressed loops suggesting partial obstruction. Contrast within the bladder. No evidence of abnormal fluid collection. No evidence of free intraperitoneal air, although the upper portions of the abdomen are excluded on this exam. IMPRESSION: Findings consistent with partial small bowel obstruction. Recommend additional follow-up plain film examination to ensure passage of oral contrast to the colon. Electronically Signed   By: Bary Richard M.D.   On: 05/10/2018 13:15    Procedures None  Hospital Course:  Patient is a 62 year old male who presented to [facility] with abdominal pain and distention.  Workup showed SBO.  Patient was admitted and started on the small bowel protocol. Improved with this and diet was advanced as tolerated.  On 05/12/18, the patient was voiding well, tolerating diet, ambulating well, pain improved, vital signs stable and felt stable for discharge home.    Physical Exam: Gen:  Alert, NAD, pleasant Card:  Regular rate and rhythm, pedal pulses 2+ BL Pulm:  Normal effort, clear to auscultation bilaterally Abd: Soft, non-tender, non-distended, bowel sounds present, no HSM Skin: warm and dry, no rashes  Psych: A&Ox3   Allergies as of 05/12/2018   No Known Allergies     Medication List    TAKE these medications   docusate sodium 100 MG capsule Commonly known as:  COLACE Take 1 capsule (100 mg total) by mouth 2 (two) times daily.   ibuprofen 200 MG tablet Commonly known as:  ADVIL,MOTRIN Take 200 mg by mouth every 6 (six) hours as needed for mild pain.   omeprazole 20 MG tablet Commonly known as:  PRILOSEC OTC Take 20 mg by mouth daily as needed (acid reflux).   polyethylene glycol packet Commonly known as:  MIRALAX / GLYCOLAX Take 17 g by mouth daily as needed for mild constipation.        Follow-up Information    [facility].   Specialty:  General Surgery Why:  Follow up as needed to discuss elective repair of umbilical hernia.  Contact information: [address] [place] [address] [phone]        Norm Salt, PA Follow up.  Specialty:  Physician Assistant Why:  Follow up as needed.  Contact information: [address] [place] [address] [phone]           Signed: Wells GuilesKelly Rayburn, [facility] 05/12/2018, 7:53 AM Pager: [phone] Consults: [phone] Mon-Fri 7:00 am-4:30  pm Sat-Sun 7:00 am-11:30 am

## 2018-05-12 NOTE — Plan of Care (Signed)

## 2018-05-12 NOTE — Progress Notes (Signed)
Ronnie PintoKristen Landino to be D/C'd  per MD order. Discussed with the patient and all questions fully answered.  VSS, Skin clean, dry and intact without evidence of skin break down, no evidence of skin tears noted.  IV catheter discontinued intact. Site without signs and symptoms of complications. Dressing and pressure applied.  An After Visit Summary was printed and given to the patient. Patient received prescription.  D/c education completed with patient/family including follow up instructions, medication list, d/c activities limitations if indicated, with other d/c instructions as indicated by MD - patient able to verbalize understanding, all questions fully answered.   Patient instructed to return to ED, call 911, or call MD for any changes in condition.   Patient to be escorted via WC, and D/C home via private auto.

## 2018-05-12 NOTE — Discharge Instructions (Signed)
Small Bowel Obstruction °A small bowel obstruction means that something is blocking the small bowel. The small bowel is also called the small intestine. It is the long tube that connects the stomach to the colon. An obstruction will stop food and fluids from passing through the small bowel. Treatment depends on what is causing the problem and how bad the problem is. °Follow these instructions at home: °· Get a lot of rest. °· Follow your diet as told by your doctor. You may need to: °? Only drink clear liquids until you start to get better. °? Avoid solid foods as told by your doctor. °· Take over-the-counter and prescription medicines only as told by your doctor. °· Keep all follow-up visits as told by your doctor. This is important. °Contact a doctor if: °· You have a fever. °· You have chills. °Get help right away if: °· You have pain or cramps that get worse. °· You throw up (vomit) blood. °· You have a feeling of being sick to your stomach (nausea) that does not go away. °· You cannot stop throwing up. °· You cannot drink fluids. °· You feel confused. °· You feel dry or thirsty (dehydrated). °· Your belly gets more bloated. °· You feel weak or you pass out (faint). °This information is not intended to replace advice given to you by your health care provider. Make sure you discuss any questions you have with your health care provider. °Document Released: 07/01/2004 Document Revised: 01/19/2016 Document Reviewed: 07/18/2014 °Elsevier Interactive Patient Education © 2018 Elsevier Inc. ° °

## 2018-07-17 ENCOUNTER — Encounter: Payer: Self-pay | Admitting: Family Medicine

## 2018-07-17 ENCOUNTER — Ambulatory Visit: Payer: PRIVATE HEALTH INSURANCE | Attending: Family Medicine | Admitting: Family Medicine

## 2018-07-17 VITALS — BP 118/76 | HR 74 | Temp 98.6°F | Resp 18 | Ht 71.0 in | Wt 239.0 lb

## 2018-07-17 DIAGNOSIS — K429 Umbilical hernia without obstruction or gangrene: Secondary | ICD-10-CM

## 2018-07-17 DIAGNOSIS — Z96651 Presence of right artificial knee joint: Secondary | ICD-10-CM | POA: Insufficient documentation

## 2018-07-17 DIAGNOSIS — M172 Bilateral post-traumatic osteoarthritis of knee: Secondary | ICD-10-CM

## 2018-07-17 MED ORDER — IBUPROFEN 600 MG PO TABS: 600.0000 mg | ORAL_TABLET | Freq: Three times a day (TID) | ORAL | 3 refills | Status: DC | PRN

## 2018-07-17 NOTE — Patient Instructions (Signed)
Hernia, Adult ° °  ° °A hernia happens when tissue inside your body pushes out through a weak spot in your belly muscles (abdominal wall). This makes a round lump (bulge). The lump may be: °· In a scar from surgery that was done in your belly (incisional hernia). °· Near your belly button (umbilical hernia). °· In your groin (inguinal hernia). Your groin is the area where your leg meets your lower belly (abdomen). This kind of hernia could also be: °? In your scrotum, if you are male. °? In folds of skin around your vagina, if you are male. °· In your upper thigh (femoral hernia). °· Inside your belly (hiatal hernia). This happens when your stomach slides above the muscle between your belly and your chest (diaphragm). °If your hernia is small and it does not cause pain, you may not need treatment. If your hernia is large or it causes pain, you may need surgery. °Follow these instructions at home: °Activity °· Avoid stretching or overusing (straining) the muscles near your hernia. Straining can happen when you: °? Lift something heavy. °? Poop (have a bowel movement). °· Do not lift anything that is heavier than 10 lb (4.5 kg), or the limit that you are told, until your doctor says that it is safe. °· Use the strength of your legs when you lift something heavy. Do not use only your back muscles to lift. °General instructions °· Do these things if told by your doctor so you do not have trouble pooping (constipation): °? Drink enough fluid to keep your pee (urine) pale yellow. °? Eat foods that are high in fiber. These include fresh fruits and vegetables, whole grains, and beans. °? Limit foods that are high in fat and processed sugars. These include foods that are fried or sweet. °? Take medicine for trouble pooping. °· When you cough, try to cough gently. °· You may try to push your hernia in by very gently pressing on it when you are lying down. Do not try to force the bulge back in if it will not push in  easily. °· If you are overweight, work with your doctor to lose weight safely. °· Do not use any products that have nicotine or tobacco in them. These include cigarettes and e-cigarettes. If you need help quitting, ask your doctor. °· If you will be having surgery (hernia repair), watch your hernia for changes in shape, size, or color. Tell your doctor if you see any changes. °· Take over-the-counter and prescription medicines only as told by your doctor. °· Keep all follow-up visits as told by your doctor. °Contact a doctor if: °· You get new pain, swelling, or redness near your hernia. °· You poop fewer times in a week than normal. °· You have trouble pooping. °· You have poop (stool) that is more dry than normal. °· You have poop that is harder or larger than normal. °Get help right away if: °· You have a fever. °· You have belly pain that gets worse. °· You feel sick to your stomach (nauseous). °· You throw up (vomit). °· Your hernia cannot be pushed in by very gently pressing on it when you are lying down. Do not try to force the bulge back in if it will not push in easily. °· Your hernia: °? Changes in shape or size. °? Changes color. °? Feels hard or it hurts when you touch it. °These symptoms may represent a serious problem that is an emergency. Do not   wait to see if the symptoms will go away. Get medical help right away. Call your local emergency services (911 in the [place] °Summary °· A hernia happens when tissue inside your body pushes out through a weak spot in the belly muscles. This creates a bulge. °· If your hernia is small and it does not hurt, you may not need treatment. If your hernia is large or it hurts, you may need surgery. °· If you will be having surgery, watch your hernia for changes in shape, size, or color. Tell your doctor about any changes. °This information is not intended to replace advice given to you by your health care provider. Make sure you discuss any questions you have with  your health care provider. °Document Released: 11/11/2009 Document Revised: 02/23/2017 Document Reviewed: 02/23/2017 °Elsevier Interactive Patient Education © 2019 Elsevier Inc. ° °
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
## 2018-07-19 ENCOUNTER — Ambulatory Visit (INDEPENDENT_AMBULATORY_CARE_PROVIDER_SITE_OTHER): Payer: PRIVATE HEALTH INSURANCE | Admitting: Orthopaedic Surgery

## 2018-07-19 ENCOUNTER — Ambulatory Visit (INDEPENDENT_AMBULATORY_CARE_PROVIDER_SITE_OTHER): Payer: PRIVATE HEALTH INSURANCE

## 2018-07-19 ENCOUNTER — Encounter (INDEPENDENT_AMBULATORY_CARE_PROVIDER_SITE_OTHER): Payer: Self-pay | Admitting: Orthopaedic Surgery

## 2018-07-19 DIAGNOSIS — Z96651 Presence of right artificial knee joint: Secondary | ICD-10-CM

## 2018-07-19 DIAGNOSIS — G8929 Other chronic pain: Secondary | ICD-10-CM

## 2018-07-19 DIAGNOSIS — M25561 Pain in right knee: Secondary | ICD-10-CM

## 2018-07-19 DIAGNOSIS — M1712 Unilateral primary osteoarthritis, left knee: Secondary | ICD-10-CM | POA: Diagnosis not present

## 2018-07-19 DIAGNOSIS — M25562 Pain in left knee: Secondary | ICD-10-CM
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
## 2018-07-28 ENCOUNTER — Telehealth (INDEPENDENT_AMBULATORY_CARE_PROVIDER_SITE_OTHER): Payer: Self-pay | Admitting: *Deleted

## 2018-07-28 NOTE — Telephone Encounter (Signed)
Received call from [place] with [employer] stating [facility] is out of network with this insurance and wants to know if can get her scheduled with an in network facility. I called Lanora Manis and left vm to return my call.

## 2018-07-31 ENCOUNTER — Telehealth: Payer: Self-pay | Admitting: Physician Assistant

## 2018-07-31 ENCOUNTER — Encounter (INDEPENDENT_AMBULATORY_CARE_PROVIDER_SITE_OTHER): Payer: Self-pay | Admitting: Orthopaedic Surgery

## 2018-07-31 ENCOUNTER — Other Ambulatory Visit: Payer: PRIVATE HEALTH INSURANCE

## 2018-07-31 NOTE — Telephone Encounter (Signed)
Patient called to update that [place] is not within their network. Please follow up.

## 2018-07-31 NOTE — Telephone Encounter (Signed)
I spoke to patient and he said that [facility] Is in the network . I will forwarder the referral to   [facility]

## 2018-08-01 ENCOUNTER — Telehealth (INDEPENDENT_AMBULATORY_CARE_PROVIDER_SITE_OTHER): Payer: Self-pay | Admitting: Orthopaedic Surgery

## 2018-08-01 ENCOUNTER — Other Ambulatory Visit (INDEPENDENT_AMBULATORY_CARE_PROVIDER_SITE_OTHER): Payer: Self-pay | Admitting: Orthopaedic Surgery

## 2018-08-01 DIAGNOSIS — G8929 Other chronic pain: Secondary | ICD-10-CM

## 2018-08-01 DIAGNOSIS — M25561 Pain in right knee: Secondary | ICD-10-CM

## 2018-08-01 DIAGNOSIS — M1711 Unilateral primary osteoarthritis, right knee: Secondary | ICD-10-CM

## 2018-08-01 NOTE — Telephone Encounter (Signed)
Pt came in to the office said he wasn't able to get the CT scan done due to it being out of network and he's asking for it to be rescheduled elsewhere In his network. Please call patient to advise.

## 2018-08-02 ENCOUNTER — Telehealth (INDEPENDENT_AMBULATORY_CARE_PROVIDER_SITE_OTHER): Payer: Self-pay | Admitting: Orthopaedic Surgery

## 2018-08-02 NOTE — Telephone Encounter (Signed)
Called patient left message on voicemail to return call. Patient is having a CT scan 08/07/2018 and need an appointment after 08/07/2018 for CT scan review

## 2018-08-02 NOTE — Telephone Encounter (Signed)
Called patient again left message to return call to schedule an appointment with Dr Roda Shutters   Patient is having a CT scan 08/07/2018    Need appointment for CT scan review

## 2018-08-02 NOTE — Telephone Encounter (Signed)
See message.

## 2018-08-02 NOTE — Telephone Encounter (Signed)
Pt is already scheduled at [facility] on 08/03/18 [facility] is in network per pt insurance. Pt is aware of appt

## 2018-08-07 ENCOUNTER — Ambulatory Visit (HOSPITAL_COMMUNITY)
Admission: RE | Admit: 2018-08-07 | Discharge: 2018-08-07 | Disposition: A | Discharge status: Home / Self Care | Payer: PRIVATE HEALTH INSURANCE | Source: Ambulatory Visit | Attending: Orthopaedic Surgery | Admitting: Orthopaedic Surgery

## 2018-08-07 DIAGNOSIS — M25561 Pain in right knee: Secondary | ICD-10-CM | POA: Insufficient documentation

## 2018-08-07 DIAGNOSIS — M1711 Unilateral primary osteoarthritis, right knee: Secondary | ICD-10-CM | POA: Insufficient documentation

## 2018-08-07 DIAGNOSIS — G8929 Other chronic pain: Secondary | ICD-10-CM | POA: Diagnosis present

## 2018-08-09 ENCOUNTER — Ambulatory Visit (INDEPENDENT_AMBULATORY_CARE_PROVIDER_SITE_OTHER): Payer: PRIVATE HEALTH INSURANCE | Admitting: Orthopaedic Surgery

## 2018-08-09 ENCOUNTER — Encounter (INDEPENDENT_AMBULATORY_CARE_PROVIDER_SITE_OTHER): Payer: Self-pay | Admitting: Orthopaedic Surgery

## 2018-08-09 DIAGNOSIS — Z96651 Presence of right artificial knee joint: Secondary | ICD-10-CM | POA: Diagnosis not present

## 2018-08-09 NOTE — Progress Notes (Signed)
   Office Visit Note   Patient: Hassaan Roudebush           Date of Birth: Feb 03, 1956           MRN: [id number] Visit Date: 08/09/2018              Requested by: Norm Salt, PA [address] [place], [place] [address] PCP: Norm Salt, [place]   Assessment & Plan: Visit Diagnoses:  1. Status post total right knee replacement     Plan: I reviewed the CT scan with the patient today which appears to be in good orientation without any evidence of complication or loosening.  From my standpoint I do not feel that revising his knee replacement would resolve his multiple complaints of foot turned outwards causing toenail loss and heel spur and grinding of the knee.  I can tell that he is very frustrated with his surgical outcome but I do not feel that I can help him with all of his complaints by merely doing a revision surgery which I do not feel is even necessary.  At this time, he will let us know who he would like me to refer him to as a second opinion. Total face to face encounter time was greater than 25 minutes and over half of this time was spent in counseling and/or coordination of care.  Follow-Up Instructions: Return if symptoms worsen or fail to improve.   Orders:  No orders of the defined types were placed in this encounter.  No orders of the defined types were placed in this encounter.     Procedures: No procedures performed   Clinical Data: No additional findings.   Subjective: Chief Complaint  Patient presents with  . Right Knee - Pain    Mr. Zollie Beckers returns today for CT scan review of his right knee.  He denies any changes in medical history.   Review of Systems   Objective: Vital Signs: There were no vitals taken for this visit.  Physical Exam  Ortho Exam Right knee exam is stable.  Surgical scar is fully healed.  2+ patellofemoral crepitus. Specialty Comments:  No specialty comments available.  Imaging: No results found.   PMFS  History: Patient Active Problem List   Diagnosis Date Noted  . SBO (small bowel obstruction) (HCC) 05/10/2018   Past Medical History:  Diagnosis Date  . History of pulmonary embolus (PE)   . SBO (small bowel obstruction) (HCC) 05/2018    History reviewed. No pertinent family history.  Past Surgical History:  Procedure Laterality Date  . TONSILLECTOMY    . TOTAL KNEE ARTHROPLASTY Right 2012   Social History   Occupational History  . Not on file  Tobacco Use  . Smoking status: Never Smoker  . Smokeless tobacco: Never Used  Substance and Sexual Activity  . Alcohol use: Never    Frequency: Never  . Drug use: Never  . Sexual activity: Not Currently

## 2018-08-10 ENCOUNTER — Encounter (INDEPENDENT_AMBULATORY_CARE_PROVIDER_SITE_OTHER): Payer: Self-pay | Admitting: Orthopaedic Surgery

## 2018-08-21 ENCOUNTER — Other Ambulatory Visit: Payer: Self-pay

## 2018-08-21 ENCOUNTER — Emergency Department (HOSPITAL_COMMUNITY)
Admission: EM | Admit: 2018-08-21 | Discharge: 2018-08-21 | Disposition: A | Discharge status: Home / Self Care | Payer: PRIVATE HEALTH INSURANCE | Attending: Emergency Medicine | Admitting: Emergency Medicine

## 2018-08-21 DIAGNOSIS — S0101XA Laceration without foreign body of scalp, initial encounter: Secondary | ICD-10-CM | POA: Insufficient documentation

## 2018-08-21 DIAGNOSIS — Y9389 Activity, other specified: Secondary | ICD-10-CM | POA: Insufficient documentation

## 2018-08-21 DIAGNOSIS — Z96651 Presence of right artificial knee joint: Secondary | ICD-10-CM | POA: Diagnosis not present

## 2018-08-21 DIAGNOSIS — Y929 Unspecified place or not applicable: Secondary | ICD-10-CM | POA: Insufficient documentation

## 2018-08-21 DIAGNOSIS — S0990XA Unspecified injury of head, initial encounter: Secondary | ICD-10-CM | POA: Diagnosis present

## 2018-08-21 DIAGNOSIS — Y998 Other external cause status: Secondary | ICD-10-CM | POA: Insufficient documentation

## 2018-08-21 MED ORDER — LIDOCAINE-EPINEPHRINE (PF) 2 %-1:200000 IJ SOLN
20.0000 mL | Freq: Once | INTRAMUSCULAR | Status: AC
  Administered 2018-08-21: 20 mL
  Filled 2018-08-21: qty 20
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
## 2018-08-21 NOTE — Discharge Instructions (Addendum)
Staple removal in 7 to 10 days, return to the emergency room for severe headache, vomiting, confusion or other concerning symptoms
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
## 2018-08-21 NOTE — ED Notes (Signed)
Patient verbalizes understanding of discharge instructions. Opportunity for questioning and answers were provided. Armband removed by staff, pt discharged from ED.  

## 2018-08-23 ENCOUNTER — Encounter: Payer: Self-pay | Admitting: Family Medicine

## 2018-08-23 NOTE — Telephone Encounter (Signed)
Refill request via mychart.

## 2018-08-24 ENCOUNTER — Other Ambulatory Visit: Payer: Self-pay | Admitting: Family Medicine

## 2018-08-24 ENCOUNTER — Encounter: Payer: Self-pay | Admitting: Surgery

## 2018-08-24 ENCOUNTER — Other Ambulatory Visit: Payer: Self-pay

## 2018-08-24 ENCOUNTER — Ambulatory Visit (INDEPENDENT_AMBULATORY_CARE_PROVIDER_SITE_OTHER): Payer: PRIVATE HEALTH INSURANCE | Admitting: Surgery

## 2018-08-24 VITALS — BP 129/69 | HR 72 | Temp 97.4°F | Ht 70.0 in | Wt 237.0 lb

## 2018-08-24 DIAGNOSIS — M172 Bilateral post-traumatic osteoarthritis of knee: Secondary | ICD-10-CM

## 2018-08-24 DIAGNOSIS — K420 Umbilical hernia with obstruction, without gangrene: Secondary | ICD-10-CM | POA: Diagnosis not present

## 2018-08-24 MED ORDER — IBUPROFEN 800 MG PO TABS: 800.0000 mg | ORAL_TABLET | Freq: Three times a day (TID) | ORAL | 1 refills | Status: DC | PRN

## 2018-08-24 NOTE — Patient Instructions (Signed)
Return in six weeks
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
## 2018-08-24 NOTE — Progress Notes (Signed)
Patient ID: Ronnie Carey, male   DOB: 31-Mar-1956, 63 y.o.   MRN: [id number]   My chart message received that patient would like a refill of ibuprofen but at 800 mg. I will send in a RX to [facility] pharmacy for #60 with 1 refill, as that dose is not carried at this pharmacy and patient can schedule follow-up appointment when needed for future refills

## 2018-08-28 ENCOUNTER — Telehealth (INDEPENDENT_AMBULATORY_CARE_PROVIDER_SITE_OTHER): Payer: Self-pay

## 2018-08-28 NOTE — Telephone Encounter (Signed)
Called patient. No answer LMOM to return our call. If they return call, please ask them screening questions below. Thank you.   Do you have now or have you had in the past 7 days a fever and/or chills?   Do you have now or have you had in the past 7 days a cough?   Do you have now or have you had in the last 7 days nausea, vomiting or abdominal pain?   Have you been exposed to anyone who has tested positive for COVID-19?   Have you or anyone who lives with you traveled within the last month? 

## 2018-08-29 ENCOUNTER — Other Ambulatory Visit: Payer: Self-pay

## 2018-08-29 ENCOUNTER — Ambulatory Visit (INDEPENDENT_AMBULATORY_CARE_PROVIDER_SITE_OTHER): Payer: PRIVATE HEALTH INSURANCE | Admitting: Orthopaedic Surgery

## 2018-08-29 DIAGNOSIS — M1712 Unilateral primary osteoarthritis, left knee: Secondary | ICD-10-CM | POA: Insufficient documentation

## 2018-08-29 NOTE — Progress Notes (Signed)
Office Visit Note   Patient: Ronnie Carey           Date of Birth: May 19, 1956           MRN: [id number] Visit Date: 08/29/2018              Requested by: Norm Salt, PA [address] [place], [place] [address] PCP: Cain Saupe, MD   Assessment & Plan: Visit Diagnoses:  1. Unilateral primary osteoarthritis, left knee     Plan: From a left knee standpoint, I do feel he would benefit from a knee replacement as does he and that is what he wants to have addressed first and I agree with this.  He understands that her surgery schedule be delayed based on the recent coronavirus pandemic.  I showed him a knee model explained in detail what this knee replacement involve and hopefully we could place a press-fit knee replacement.  As far as his right knee goes, I would hold off on any other intervention because I cannot guarantee him that changing out components would change his alignment of that leg at all.  He may need to see a revision specialist at a university level to hear this as well.  All question concerns were answered and addressed.  Follow-Up Instructions: Return for 2 weeks post-op.   Orders:  No orders of the defined types were placed in this encounter.  No orders of the defined types were placed in this encounter.     Procedures: No procedures performed   Clinical Data: No additional findings.   Subjective: Chief Complaint  Patient presents with   Left Knee - Pain   Right Knee - Pain  The patient is someone I am seeing for second opinion as a relates to his right total knee arthroplasty that was performed many years ago.  I have reviewed my partner Dr. Warren Danes notes thoroughly on this patient.  The patient is very dissatisfied with his right total knee arthroplasty in terms of the rotation issues he has with his leg rotating out on the right side and he demonstrates that in the office today.  He has lost plain films and a CT scan that is been reviewed and I  reviewed these thoroughly as well.  He is also been dealing with debilitating end-stage arthritis in his left knee and would like to consider knee replacement left knee.  He understands that were delayed on any type of surgery due to the recent coronavirus pandemic.  He has been dealing with left knee pain for some time now and is failed all forms conservative treatment including multiple injections.  He does have end-stage arthritis in that left knee.  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  He has a heel spur as well with his right calcaneus. He denies any hip issues denies any groin pain. HPI  Review of Systems He currently denies any headache, chest pain, shortness of breath, fever, chills, nausea, vomiting  Objective: Vital Signs: There were no vitals taken for this visit.  Physical Exam He is alert and orient x3 and in no acute distress he walks with no assistive device but a slight limp.  He does stand with his toes straight but then his foot rotates out which may be related to eversion issue with his hip. Ortho  Exam Examination of both hips show that there is no pain in the groin with full rotation and no blocks rotation.  I do not perceive that there is a significant amount of version in one hip compared to the other.  When he lays flat on the exam table his right leg does slightly externally rotated compared to the right and left legs.  When he stands he can hold both feet straight but then he tends to move with his foot rotated out.  His incisions well-healed on the right side and the knee has full range of motion with some slight crepitation of the patellofemoral implant interface but I feel no instability with drawer sign being negative and there is no instability with varus and valgus stressing.  His range of motion is full.  The knee  does slightly hyperextend though.  Examination of his left knee show significant varus malalignment with patellofemoral crepitation and significant medial joint line tenderness.  That knee is ligamentously stable on the left side and his range of motion is full but he lacks full extension by at least 3 to 5 degrees. Specialty Comments:  No specialty comments available.  Imaging: No results found. I reviewed his CT scan and x-rays in detail with him.  He has no evidence of hardware failure on the right knee replacement at all and I see no evidence of malalignment based on the CT scan.  I am unsure as to why he wants to rotate his leg out or seems rotated out.  His left knee show severe end-stage arthritic changes with varus malalignment with complete loss of the joint space.  There is tricompartmental arthritic changes and osteophytes in all 3 compartments.  PMFS History: Patient Active Problem List   Diagnosis Date Noted   Unilateral primary osteoarthritis, left knee 08/29/2018   Incarcerated umbilical hernia 08/24/2018   SBO (small bowel obstruction) (HCC) 05/10/2018   Past Medical History:  Diagnosis Date   History of pulmonary embolus (PE)    SBO (small bowel obstruction) (HCC) 05/2018    No family history on file.  Past Surgical History:  Procedure Laterality Date   TONSILLECTOMY     TOTAL KNEE ARTHROPLASTY Right 2012   Social History   Occupational History   Not on file  Tobacco Use   Smoking status: Never Smoker   Smokeless tobacco: Never Used  Substance and Sexual Activity   Alcohol use: Never    Frequency: Never   Drug use: Never   Sexual activity: Not Currently

## 2018-09-04 ENCOUNTER — Encounter (HOSPITAL_COMMUNITY): Payer: Self-pay | Admitting: *Deleted

## 2018-09-04 ENCOUNTER — Other Ambulatory Visit: Payer: Self-pay

## 2018-09-04 ENCOUNTER — Emergency Department (HOSPITAL_COMMUNITY)
Admission: EM | Admit: 2018-09-04 | Discharge: 2018-09-04 | Disposition: A | Discharge status: Home / Self Care | Payer: PRIVATE HEALTH INSURANCE | Attending: Emergency Medicine | Admitting: Emergency Medicine

## 2018-09-04 DIAGNOSIS — Z79899 Other long term (current) drug therapy: Secondary | ICD-10-CM | POA: Insufficient documentation

## 2018-09-04 DIAGNOSIS — Z96651 Presence of right artificial knee joint: Secondary | ICD-10-CM | POA: Diagnosis not present

## 2018-09-04 DIAGNOSIS — Z4802 Encounter for removal of sutures: Secondary | ICD-10-CM | POA: Diagnosis present

## 2018-09-04 NOTE — ED Notes (Signed)
Patient verbalizes understanding of discharge instructions . Opportunity for questions and answers were provided . Armband removed by staff ,Pt discharged from ED. W/C  offered at D/C  and Declined W/C at D/C and was escorted to lobby by RN.  

## 2018-09-04 NOTE — ED Triage Notes (Signed)
PT has staples on Lt side od scalp
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
## 2018-09-04 NOTE — Discharge Instructions (Signed)
It was my pleasure taking care of you today!   Keep area clean.   Return to ER for new symptoms, any additional concerns.

## 2018-10-02 ENCOUNTER — Other Ambulatory Visit: Payer: Self-pay | Admitting: Family Medicine

## 2018-10-02 ENCOUNTER — Encounter: Payer: Self-pay | Admitting: Family Medicine

## 2018-10-02 DIAGNOSIS — M172 Bilateral post-traumatic osteoarthritis of knee: Secondary | ICD-10-CM

## 2018-10-02 MED ORDER — IBUPROFEN 800 MG PO TABS: 800.0000 mg | ORAL_TABLET | Freq: Three times a day (TID) | ORAL | 1 refills | Status: DC | PRN

## 2018-10-02 NOTE — Progress Notes (Signed)
Patient ID: Ronnie Carey, male   DOB: [birth year], 63 y.o.   MRN: [id number]   My chart message received that patient would like refill of ibuprofen and also that patient with a 2 cm nodule on his left posterior calf and patient concerned about a blood clot. Suggested that patient go to local urgent care or ED for further evaluation. Ibuprofen refilled

## 2018-10-02 NOTE — Telephone Encounter (Signed)
Patient mychart concern.

## 2018-10-05 ENCOUNTER — Ambulatory Visit (INDEPENDENT_AMBULATORY_CARE_PROVIDER_SITE_OTHER): Payer: PRIVATE HEALTH INSURANCE | Admitting: Surgery

## 2018-10-05 ENCOUNTER — Encounter: Payer: Self-pay | Admitting: Surgery

## 2018-10-05 ENCOUNTER — Other Ambulatory Visit: Payer: Self-pay

## 2018-10-05 VITALS — BP 135/82 | HR 63 | Temp 97.5°F | Resp 16 | Ht 71.0 in | Wt 239.6 lb

## 2018-10-05 DIAGNOSIS — K420 Umbilical hernia with obstruction, without gangrene: Secondary | ICD-10-CM | POA: Diagnosis not present

## 2018-10-05 NOTE — Progress Notes (Signed)
Surgical Clinic Progress/Follow-up Note   HPI:  63 y.o. Male presents to clinic for follow-up evaluation of his umbilical hernia. Patient reports it doesn't bother him, proudly stating "I just built a deck" with a lot of heavy lifting without any umbilical pain. He says it only hurts when he or somebody tries to push it back it, though says "I'm always able to get it back in". He otherwise reports +flatus and +BM's WNL and complains only of his Left knee arthritis and chronic lower back pain with sciatica. Of note, he also had scalp staples removed following traumatic laceration. He denies any fever/chills, N/V, abdominal distention, CP, or SOB.  Review of Systems:  Constitutional: denies any other weight loss, fever, chills, or sweats  Eyes: denies any other vision changes, history of eye injury  ENT: denies sore throat, hearing problems  Respiratory: denies shortness of breath, wheezing  Cardiovascular: denies chest pain, palpitations  Gastrointestinal: abdominal pain, N/V, and bowel function as per interval history Musculoskeletal: denies any other joint pains or cramps except as per interval history Skin: Denies any other rashes or skin discolorations  Neurological: denies any other headache, dizziness, weakness  Psychiatric: denies any other depression, anxiety  All other review of systems: otherwise negative   Vital Signs:  BP 135/82   Pulse 63   Temp (!) 97.5 F (36.4 C) (Temporal)   Resp 16   Ht 5\' 11"  (1.803 m)   Wt 239 lb 9.6 oz (108.7 kg)   SpO2 93%   BMI 33.42 kg/m    Physical Exam:  Constitutional:  -- Overweight body habitus  -- Awake, alert, and oriented x3  Eyes:  -- Pupils equally round and reactive to light  -- No scleral icterus  Ear, nose, throat:  -- No jugular venous distension  -- No nasal drainage, bleeding Pulmonary:  -- No crackles -- Equal breath sounds bilaterally -- Breathing non-labored at rest Cardiovascular:  -- S1, S2 present  -- No  pericardial rubs  Gastrointestinal:  -- Abdomen soft, non-tender to palpation, non-distended, no guarding/rebound tenderness -- Non-reducible vs difficult to reduce umbilical hernia, for which patient refused any more than only brief attempt -- No other abdominal masses appreciated, pulsatile or otherwise Musculoskeletal / Integumentary:  -- Wounds or skin discoloration: None appreciated  -- Extremities: B/L UE and LE FROM, hands and feet warm, no edema  Neurologic:  -- Motor function: intact and symmetric  -- Sensation: intact and symmetric   Imaging: No new pertinent imaging studies available for review at this time   Assessment:  63 y.o. yo Male with a problem list including...  Patient Active Problem List   Diagnosis Date Noted  . Unilateral primary osteoarthritis, left knee 08/29/2018  . Incarcerated umbilical hernia 08/24/2018  . SBO (small bowel obstruction) (HCC) 05/10/2018    presents to clinic for follow-up evaluation of his reportedly minimally to no longer symptomatic, but difficult to reduce vs incarcerated, umbilical hernia, complicated by pertinent comorbidities including recent SBO without prior abdominal surgery, chronic lower back pain with sciatica, history of PE s/p Right knee replacement, and recent traumatic scalp laceration.  - discussed with patient signs and symptoms of hernia incarceration and obstruction - strategies for manual self-reduction of patient's umbilical hernia also reviewed and discussed             - maintain hydration with high fiber heart healthy diet to reduce/minimize constipation +/- daily stool softener as needed - all risks, benefits, and alternatives torepair of his umbilical  hernia with meshwere discussed with the patient, all of his questions were answered to patient's expressed satisfaction, patient expresses he does not want to have his hernia repaired at this time. - instructed to call  if any questions or concerns  All of the above recommendations were discussed with the patient, and all of patient's questions were answered to his expressed satisfaction.  -- Scherrie GerlachJason E. Earlene Plateravis, MD, RPVI [place]: [facility] General Surgery - Partnering for exceptional care. Office: [phone]

## 2018-10-05 NOTE — Patient Instructions (Addendum)
Please call our office if you have questions or concerns. You may try an abdominal binder. This can be purchased at any drug store or [employer] store.    Umbilical Hernia, Adult  A hernia is a bulge of tissue that pushes through an opening between muscles. An umbilical hernia happens in the abdomen, near the belly button (umbilicus). The hernia may contain tissues from the small intestine, large intestine, or fatty tissue covering the intestines (omentum). Umbilical hernias in adults tend to get worse over time, and they require surgical treatment. There are several types of umbilical hernias. You may have:  A hernia located just above or below the umbilicus (indirect hernia). This is the most common type of umbilical hernia in adults.  A hernia that forms through an opening formed by the umbilicus (direct hernia).  A hernia that comes and goes (reducible hernia). A reducible hernia may be visible only when you strain, lift something heavy, or cough. This type of hernia can be pushed back into the abdomen (reduced).  A hernia that traps abdominal tissue inside the hernia (incarcerated hernia). This type of hernia cannot be reduced.  A hernia that cuts off blood flow to the tissues inside the hernia (strangulated hernia). The tissues can start to die if this happens. This type of hernia requires emergency treatment. What are the causes? An umbilical hernia happens when tissue inside the abdomen presses on a weak area of the abdominal muscles. What increases the risk? You may have a greater risk of this condition if you:  Are obese.  Have had several pregnancies.  Have a buildup of fluid inside your abdomen (ascites).  Have had surgery that weakens the abdominal muscles. What are the signs or symptoms? The main symptom of this condition is a painless bulge at or near the belly button. A reducible hernia may be visible only when you strain, lift something heavy, or cough. Other  symptoms may include:  Dull pain.  A feeling of pressure. Symptoms of a strangulated hernia may include:  Pain that gets increasingly worse.  Nausea and vomiting.  Pain when pressing on the hernia.  Skin over the hernia becoming red or purple.  Constipation.  Blood in the stool. How is this diagnosed? This condition may be diagnosed based on:  A physical exam. You may be asked to cough or strain while standing. These actions increase the pressure inside your abdomen and force the hernia through the opening in your muscles. Your health care provider may try to reduce the hernia by pressing on it.  Your symptoms and medical history. How is this treated? Surgery is the only treatment for an umbilical hernia. Surgery for a strangulated hernia is done as soon as possible. If you have a small hernia that is not incarcerated, you may need to lose weight before having surgery. Follow these instructions at home:  Lose weight, if told by your health care provider.  Do not try to push the hernia back in.  Watch your hernia for any changes in color or size. Tell your health care provider if any changes occur.  You may need to avoid activities that increase pressure on your hernia.  Do not lift anything that is heavier than 10 lb (4.5 kg) until your health care provider says that this is safe.  Take over-the-counter and prescription medicines only as told by your health care provider.  Keep all follow-up visits as told by your health care provider. This is important. Contact a  health care provider if:  Your hernia gets larger.  Your hernia becomes painful. Get help right away if:  You develop sudden, severe pain near the area of your hernia.  You have pain as well as nausea or vomiting.  You have pain and the skin over your hernia changes color.  You develop a fever. This information is not intended to replace advice given to you by your health care provider. Make sure you  discuss any questions you have with your health care provider. Document Released: 10/24/2015 Document Revised: 07/06/2017 Document Reviewed: 11/22/2016 Elsevier Interactive Patient Education  2019 [employer].

## 2018-10-23 ENCOUNTER — Telehealth: Payer: Self-pay | Admitting: Orthopaedic Surgery

## 2018-10-23 NOTE — Telephone Encounter (Signed)
Patient called requesting a copy Ct Scan and XRay and wants you to call when it is ready.

## 2018-10-23 NOTE — Telephone Encounter (Signed)
Patient is aware xray CD is ready for pickup, patient stated he already had CT scan transferred to [facility].

## 2018-10-23 NOTE — Telephone Encounter (Signed)
Please call patient [phone] when it's ready.

## 2018-11-17 ENCOUNTER — Encounter: Payer: Self-pay | Admitting: Family Medicine

## 2018-11-17 DIAGNOSIS — M172 Bilateral post-traumatic osteoarthritis of knee: Secondary | ICD-10-CM

## 2018-11-17 NOTE — Telephone Encounter (Signed)
Please refill mychart request if appropriate.

## 2018-11-20 MED ORDER — OMEPRAZOLE MAGNESIUM 20 MG PO TBEC: 20.0000 mg | DELAYED_RELEASE_TABLET | Freq: Every day | ORAL | 2 refills | Status: DC | PRN

## 2018-11-20 MED ORDER — IBUPROFEN 800 MG PO TABS: 800.0000 mg | ORAL_TABLET | Freq: Three times a day (TID) | ORAL | 1 refills | Status: DC | PRN

## 2018-12-04 ENCOUNTER — Encounter: Payer: Self-pay | Admitting: Family Medicine

## 2018-12-04 ENCOUNTER — Other Ambulatory Visit: Payer: Self-pay

## 2018-12-04 ENCOUNTER — Telehealth (HOSPITAL_BASED_OUTPATIENT_CLINIC_OR_DEPARTMENT_OTHER): Payer: PRIVATE HEALTH INSURANCE | Admitting: Family Medicine

## 2018-12-04 ENCOUNTER — Ambulatory Visit: Payer: PRIVATE HEALTH INSURANCE | Attending: Family Medicine

## 2018-12-04 DIAGNOSIS — N3001 Acute cystitis with hematuria: Secondary | ICD-10-CM

## 2018-12-04 DIAGNOSIS — R300 Dysuria: Secondary | ICD-10-CM | POA: Diagnosis not present

## 2018-12-04 DIAGNOSIS — Z791 Long term (current) use of non-steroidal anti-inflammatories (NSAID): Secondary | ICD-10-CM

## 2018-12-04 DIAGNOSIS — R3912 Poor urinary stream: Secondary | ICD-10-CM

## 2018-12-04 DIAGNOSIS — N342 Other urethritis: Secondary | ICD-10-CM | POA: Diagnosis not present

## 2018-12-04 LAB — POCT URINALYSIS DIP (CLINITEK)
Bilirubin, UA: NEGATIVE
Glucose, UA: 100 mg/dL — AB
Nitrite, UA: POSITIVE — AB
POC PROTEIN,UA: 30 — AB
Spec Grav, UA: 1.025
Urobilinogen, UA: 0.2 U/dL
pH, UA: 6.5

## 2018-12-04 MED ORDER — CIPROFLOXACIN HCL 500 MG PO TABS: 500.0000 mg | ORAL_TABLET | Freq: Two times a day (BID) | ORAL | 0 refills | Status: DC

## 2018-12-04 MED ORDER — TRIAMCINOLONE ACETONIDE 0.5 % EX OINT: 1.0000 "application " | TOPICAL_OINTMENT | Freq: Two times a day (BID) | CUTANEOUS | 2 refills | Status: DC

## 2018-12-04 MED ORDER — OMEPRAZOLE 20 MG PO CPDR: 20.0000 mg | DELAYED_RELEASE_CAPSULE | Freq: Every day | ORAL | 3 refills | Status: DC

## 2018-12-04 MED FILL — OMEPRAZOLE 20 MG CAP: 20 | 30 days supply | Qty: 30 | Fill #0

## 2018-12-04 MED FILL — TRIAMCINOLONE 0.5% OINTMENT: 0.5 | 14 days supply | Qty: 30 | Fill #0

## 2018-12-04 MED FILL — CIPROFLOXACIN HCL 500 MG TA: 500 | 10 days supply | Qty: 20 | Fill #0

## 2018-12-04 NOTE — Progress Notes (Signed)
Virtual Visit via Video Note  I connected with Ronnie Carey on 12/04/18 at  9:10 AM EDT by a video enabled telemedicine application and verified that I am speaking with the correct person using two identifiers.  Location: Patient: Home Provider: Office   I discussed the limitations of evaluation and management by telemedicine and the availability of in person appointments. The patient expressed understanding and agreed to proceed.  History of Present Illness:      63 year old male with complaint of onset last Thursday of weakening of the urinary stream and difficulty urinating and a burning sensation in his lower abdomen/pelvic area.  Since that time he has also had increased urinary frequency and now feels that the burning sensation has traveled down his urethra.  He denies any exposure to sexually transmitted diseases.  He also for the past 3 to 4 days had a mild fever ranging between 98.9-99.5.  Occasional mild sensation of chills.  He denies any nausea or increased back pain.  He states that he did have prostatitis once when he was about 69 to 63 years old.  He would like to have PSA as a screening test for prostate cancer.  He also reports that he never received prescription for omeprazole that was sent to the pharmacy at his last visit due to his long-term use of ibuprofen for treatment of pain related to osteoarthritis of his knees.    Observations/Objective: Patient does not appear to be any acute distress at today's video visit.  Patient with normal respiratory rate, fluent speech, no alteration in mental status. No vital signs or dedicated physical exam as visit was conducted by video  Assessment and Plan: 1. Dysuria Patient with complaint of recent onset since last Thursday of dysuria/burning with urination.  Patient will come into the office this afternoon to give urinalysis.  Urine will be sent for culture to look for possible urinary tract infection as the cause of his symptoms  but I also discussed with patient that other conditions such as urethritis can cause pain/discomfort with urination - POCT URINALYSIS DIP (CLINITEK)  2. Urethritis Patient denies exposure to STIs and reports no sexual activity in approximately 6 years.  Patient has had mild fever as well as chills in addition to dysuria and weakening of the urinary stream.  Patient will come into the office for urinalysis which will also be sent for culture.  Will check CBC to look for elevated white blood cell count in light of his fever and patient is being placed on Cipro 500 mg x 10 days which would help with either urethritis or prostatitis.  Patient will also be referred to urology for further evaluation and treatment - POCT URINALYSIS DIP (CLINITEK) - CBC with Differential; Future - ciprofloxacin (CIPRO) 500 MG tablet; Take 1 tablet (500 mg total) by mouth 2 (two) times daily.  Dispense: 20 tablet; Refill: 0  3. Weak urinary stream Patient with complaint of weakening of the urinary stream along with burning with urination.  Patient will come into the office to give a urine sample to look for signs of infection.  He will additionally be referred to urology for further evaluation and treatment.  Patient will also have a PSA as a screening test for prostate cancer. - POCT URINALYSIS DIP (CLINITEK) - PSA; Future  4. Acute cystitis with hematuria Patient came into the office and gave a sample for a UA which was abnormal and contained leukocytes, nitrites and blood. Urine sent for culture  Patient is  to start the use of Cipro.   5. Long term current use of non-steroidal anti-inflammatories (NSAID) Omeprazole sent to the pharmacy for stomach protection due to patient's long-term use of nonsteroidal anti-inflammatories for treatment of pain related to osteoarthritis of the knees - omeprazole (PRILOSEC) 20 MG capsule; Take 1 capsule (20 mg total) by mouth daily. To reduce stomach acid  Dispense: 90 capsule; Refill:  3  Follow Up Instructions:Return in about 1 week (around 12/11/2018).  *Addendum: Patient requested refill of triamcinolone 0.5% cream which he had been prescribed by another provider to help with skin issues associated with his peripheral edema/lymphedema.  Patient reports a quarter sized area of skin irritation for which he would like to use the triamcinolone and prescription was sent to pharmacy.   I discussed the assessment and treatment plan with the patient. The patient was provided an opportunity to ask questions and all were answered. The patient agreed with the plan and demonstrated an understanding of the instructions.   The patient was advised to call back or seek an in-person evaluation if the symptoms worsen or if the condition fails to improve as anticipated.  I provided 14 minutes of non-face-to-face time during this encounter. (Patient was technically visualized during the visit which was video enabled)   Cain Saupeammie Romone Shaff, MD

## 2018-12-05 ENCOUNTER — Telehealth: Payer: Self-pay | Admitting: Emergency Medicine

## 2018-12-05 ENCOUNTER — Other Ambulatory Visit: Payer: Self-pay | Admitting: Family Medicine

## 2018-12-05 ENCOUNTER — Encounter: Payer: Self-pay | Admitting: Family Medicine

## 2018-12-05 DIAGNOSIS — R3912 Poor urinary stream: Secondary | ICD-10-CM

## 2018-12-05 DIAGNOSIS — R972 Elevated prostate specific antigen [PSA]: Secondary | ICD-10-CM

## 2018-12-05 LAB — CBC WITH DIFFERENTIAL/PLATELET
Basophils Absolute: 0.1 x10E3/uL (ref 0.0–0.2)
Basos: 0 %
EOS (ABSOLUTE): 0.4 x10E3/uL (ref 0.0–0.4)
Eos: 4 %
Hematocrit: 40.6 % (ref 37.5–51.0)
Hemoglobin: 13.9 g/dL (ref 13.0–17.7)
Immature Grans (Abs): 0 x10E3/uL (ref 0.0–0.1)
Immature Granulocytes: 0 %
Lymphocytes Absolute: 2.2 x10E3/uL (ref 0.7–3.1)
Lymphs: 20 %
MCH: 30.7 pg (ref 26.6–33.0)
MCHC: 34.2 g/dL (ref 31.5–35.7)
MCV: 90 fL (ref 79–97)
Monocytes Absolute: 1.3 x10E3/uL — ABNORMAL HIGH (ref 0.1–0.9)
Monocytes: 12 %
Neutrophils Absolute: 7.1 x10E3/uL — ABNORMAL HIGH (ref 1.4–7.0)
Neutrophils: 64 %
Platelets: 269 x10E3/uL (ref 150–450)
RBC: 4.53 x10E6/uL (ref 4.14–5.80)
RDW: 12.6 % (ref 11.6–15.4)
WBC: 11.1 x10E3/uL — ABNORMAL HIGH (ref 3.4–10.8)

## 2018-12-05 LAB — PSA: Prostate Specific Ag, Serum: 8.1 ng/mL — ABNORMAL HIGH (ref 0.0–4.0)

## 2018-12-05 NOTE — Telephone Encounter (Signed)
Patient contacted via phone to be given results of labs.  Patient identified by name and date of birth.  Patient given results of labs.  Patient educated on lab results. Questions answered. Patient acknowledged understanding of labs results. 

## 2018-12-05 NOTE — Progress Notes (Signed)
Patient ID: Ronnie Carey, male   DOB: Feb 11, 1956, 63 y.o.   MRN: [id number]   Patient with elevated PSA of 8.1 on recent bloodwork in addition to recent UTI and complaint of weakening of his urinary stream. Will placed referral to Urology.

## 2018-12-06 LAB — URINE CULTURE

## 2018-12-07 NOTE — Telephone Encounter (Signed)
Patient would like clarity on PSA and what can cause the elevation.

## 2018-12-21 ENCOUNTER — Encounter: Payer: Self-pay | Admitting: Family Medicine

## 2018-12-22 ENCOUNTER — Telehealth: Payer: Self-pay | Admitting: Family Medicine

## 2018-12-22 NOTE — Telephone Encounter (Signed)
Pt called states he is willing to go to [facility] or [place] or any Urologist the is in network with his insurance AND [facility] recommends as a good urologist to see. Please coordinate and contact pt.

## 2018-12-26 ENCOUNTER — Telehealth: Payer: Self-pay | Admitting: Family Medicine

## 2018-12-26 NOTE — Telephone Encounter (Signed)
Patient called wanting to get a referral for urology   Patient would like to be referred to [facility]   [phone]    please follow up.

## 2018-12-29 NOTE — Telephone Encounter (Signed)
Noted. Thanks.

## 2019-01-03 ENCOUNTER — Telehealth: Payer: Self-pay | Admitting: Family Medicine

## 2019-01-03 NOTE — Telephone Encounter (Signed)
Message will be sent to provider to put in new referral due to previous referral being accidentally closed.

## 2019-01-03 NOTE — Telephone Encounter (Addendum)
Pt called because his referral keeps getting closed, he states the referral has to go through the system, as well as faxed so it doesn't accidentally get deleted in the system [phone]..please follow up

## 2019-01-08 ENCOUNTER — Other Ambulatory Visit: Payer: Self-pay | Admitting: Family Medicine

## 2019-01-08 DIAGNOSIS — R300 Dysuria: Secondary | ICD-10-CM

## 2019-01-08 DIAGNOSIS — R3912 Poor urinary stream: Secondary | ICD-10-CM

## 2019-01-08 DIAGNOSIS — R972 Elevated prostate specific antigen [PSA]: Secondary | ICD-10-CM

## 2019-01-08 DIAGNOSIS — N3001 Acute cystitis with hematuria: Secondary | ICD-10-CM

## 2019-01-08 NOTE — Telephone Encounter (Signed)
Spoke with patient and he stated that the appt was already scheduled.

## 2019-01-08 NOTE — Telephone Encounter (Signed)
I am guessing that he needs another Urology referral. I will place a new referral. Please make sure that patient is made aware of the phone number in order to follow-up with Urology

## 2019-01-08 NOTE — Progress Notes (Signed)
Patient ID: Ronnie Carey, male   DOB: 12-09-1955, 63 y.o.   MRN: [id number]   Phone message received that patient thinks that his Urology referral may have been accidentally closed and he would like to have a new referral placed

## 2019-01-09 NOTE — Telephone Encounter (Signed)
Noted   Sent referral yesterday  To [facility]  Per patient request

## 2019-01-10 DIAGNOSIS — Z86718 Personal history of other venous thrombosis and embolism: Secondary | ICD-10-CM | POA: Insufficient documentation

## 2019-01-11 ENCOUNTER — Encounter: Payer: Self-pay | Admitting: Family Medicine

## 2019-01-19 ENCOUNTER — Other Ambulatory Visit: Payer: Self-pay | Admitting: Family Medicine

## 2019-01-19 DIAGNOSIS — M172 Bilateral post-traumatic osteoarthritis of knee: Secondary | ICD-10-CM

## 2019-01-19 MED ORDER — IBUPROFEN 800 MG PO TABS: 800.0000 mg | ORAL_TABLET | Freq: Three times a day (TID) | ORAL | 1 refills | Status: DC | PRN

## 2019-01-19 NOTE — Progress Notes (Signed)
Patient ID: Ronnie Carey, male   DOB: 04-Sep-1955, 63 y.o.   MRN: [id number]   My chart message received from patient requesting refill of Ibuprofen due to OA of the knees with chronic pain. Refill will be provided and patient will be sent message to see if he would like Orthopedic referral in follow-up of his knee pain.

## 2019-01-29 ENCOUNTER — Other Ambulatory Visit: Payer: Self-pay

## 2019-01-29 DIAGNOSIS — R300 Dysuria: Secondary | ICD-10-CM

## 2019-01-30 ENCOUNTER — Encounter: Payer: Self-pay | Admitting: Urology

## 2019-01-30 ENCOUNTER — Other Ambulatory Visit: Payer: Self-pay

## 2019-01-30 ENCOUNTER — Other Ambulatory Visit
Admission: RE | Admit: 2019-01-30 | Discharge: 2019-01-30 | Disposition: A | Discharge status: Home / Self Care | Payer: PRIVATE HEALTH INSURANCE | Attending: Urology | Admitting: Urology

## 2019-01-30 ENCOUNTER — Ambulatory Visit (INDEPENDENT_AMBULATORY_CARE_PROVIDER_SITE_OTHER): Payer: PRIVATE HEALTH INSURANCE | Admitting: Urology

## 2019-01-30 VITALS — BP 113/65 | HR 86 | Ht 71.0 in | Wt 248.0 lb

## 2019-01-30 DIAGNOSIS — R300 Dysuria: Secondary | ICD-10-CM

## 2019-01-30 DIAGNOSIS — N410 Acute prostatitis: Secondary | ICD-10-CM

## 2019-01-30 DIAGNOSIS — Z125 Encounter for screening for malignant neoplasm of prostate: Secondary | ICD-10-CM

## 2019-01-30 DIAGNOSIS — R972 Elevated prostate specific antigen [PSA]: Secondary | ICD-10-CM | POA: Diagnosis present

## 2019-01-30 LAB — URINALYSIS, COMPLETE (UACMP) WITH MICROSCOPIC
Bacteria, UA: NONE SEEN
Glucose, UA: NEGATIVE mg/dL
Hgb urine dipstick: NEGATIVE
Leukocytes,Ua: NEGATIVE
Nitrite: NEGATIVE
Protein, ur: NEGATIVE mg/dL
RBC / HPF: NONE SEEN RBC/hpf (ref 0–5)
Specific Gravity, Urine: 1.025 (ref 1.005–1.030)
Squamous Epithelial / LPF: NONE SEEN (ref 0–5)
pH: 5 (ref 5.0–8.0)

## 2019-01-30 NOTE — Progress Notes (Signed)
   01/30/19 9:11 AM   Ronnie Carey 03-Jun-1956 [id number]  Referring provider: Antony Blackbird, MD [place],  [place] [address]  CC: Elevated PSA, urinary symptoms  HPI: I saw Ronnie Carey in urology clinic today in consultation for elevated PSA and prostatitis.  He is a healthy 63 year old male with no family history of prostate cancer who presented to his PCP at the end of June with urinary symptoms of perineal pain, dysuria, urgency, and frequency.  Dipstick urinalysis at that time was concerning for infection with leukocytes and nitrite positive, culture grew >100k but was not speciated.  PSA was elevated at 8.1 at the time of these acute symptoms from a baseline PSA of approximately 2.  His symptoms completely resolved with a 10-day course of antibiotics.  There are no aggravating factors.  He denies any prior history of UTI or prostatitis.  He denies any urinary symptoms at baseline of weak stream, feeling of incomplete emptying, urgency, or frequency.  Severity is mild to moderate.  Of note, he does extensive motorcycle riding as well.  IPSS score today is 1, with quality of life pleased.  PVR is 84 mL.   PMH: Past Medical History:  Diagnosis Date  . History of pulmonary embolus (PE)   . SBO (small bowel obstruction) ([facility]) 05/2018    Surgical History: Past Surgical History:  Procedure Laterality Date  . TONSILLECTOMY    . TOTAL KNEE ARTHROPLASTY Right 2012   Allergies: No Known Allergies  Family History: No family history of prostate cancer  Social History:  reports that he has never smoked. He has never used smokeless tobacco. He reports that he does not drink alcohol or use drugs.  ROS: Please see flowsheet from today's date for complete review of systems.  Physical Exam: There were no vitals taken for this visit.   Constitutional:  Alert and oriented, No acute distress. Cardiovascular: No clubbing, cyanosis, or edema. Respiratory: Normal respiratory  effort, no increased work of breathing. GI: Abdomen is soft, nontender, nondistended, no abdominal masses GU: No CVA tenderness DRE: 30 g, smooth, no nodules Lymph: No cervical or inguinal lymphadenopathy. Skin: No rashes, bruises or suspicious lesions. Neurologic: Grossly intact, no focal deficits, moving all 4 extremities. Psychiatric: Normal mood and affect.  Laboratory Data: Reviewed, see HPI  Pertinent Imaging: I have personally reviewed the CT from 2019, prostate measures 38 cc  Assessment & Plan:   In summary, the patient is a healthy 63 year old male with no family history of prostate cancer and benign DRE who had a single elevated PSA of 8.1 at the time of documented infection and symptoms of prostatitis.  We discussed at length the role of the PSA and prostate cancer screening.  We reviewed the implications of an elevated PSA and the uncertainty surrounding it. In general, a man's PSA increases with age and is produced by both normal and cancerous prostate tissue. The differential diagnosis for elevated PSA includes BPH, prostate cancer, infection, recent intercourse/ejaculation, recent urethroscopic manipulation (foley placement/cystoscopy) or trauma, and prostatitis.   I highly suspect that his PSA was elevated secondary to acute prostatitis.  We will repeat PSA with free/total ratio today, call with results.  Continue yearly PSA screening with PCP if downtrending or normal.   Billey Co, MD  [facility] [address], [place],  [address] [phone]

## 2019-01-30 NOTE — Patient Instructions (Signed)
Prostate Cancer Screening  The prostate is a walnut-sized gland that is located below the bladder and in front of the rectum in males. The function of the prostate (prostate gland) is to add fluid to semen during ejaculation. Prostate cancer is the second most common type of cancer in men. A screening test for cancer is a test that is done before cancer symptoms start. Screening can help to identify cancer at an early stage, when the cancer can be treated more easily. The recommended prostate cancer screening test is a blood test called the prostate-specific antigen (PSA) test. PSA is a protein that is made in the prostate. As you age, your prostate naturally produces more PSA. Abnormally high PSA levels may be caused by:  Prostate cancer.  An enlarged prostate that is not caused by cancer (benign prostatic hyperplasia, BPH). This condition is very common in older men.  A prostate gland infection (prostatitis).  Medicines to assist with hair growth, such as finasteride. Depending on the PSA results, you may need more tests, such as:  A physical exam to check the size of your prostate gland.  Blood and imaging tests.  A procedure to remove tissue samples from your prostate gland for testing (biopsy). Who should have screening? Screening recommendations vary based on age.  If you are younger than age 40, screening is not recommended.  If you are age 40-54 and you have no risk factors, screening is not recommended.  If you are younger than age 55, ask your health care provider if you need screening if you have one of these risk factors: ? Being of African-American descent. ? Having a family history of prostate cancer.  If you are age 55-69, talk with your health care provider about your need for screening and how often screening should be done.  If you are older than age 70, screening is not recommended. This is because the risks that screening can cause are greater than the benefits  that it may provide (risks outweigh the benefits). If you are at high risk for prostate cancer, your health care provider may recommend that you have screenings more often or start screening at a younger age. You may be at high risk if you:  Are older than age 55.  Are African-American.  Have a father, brother, or uncle who has been diagnosed with prostate cancer. The risk may be higher if your family member's cancer occurred at an early age. What are the benefits of screening? There is a small chance that screening may lower your risk of dying from prostate cancer. The chance is small because prostate cancer is typically a slow-growing cancer, and most men with prostate cancer die from a different cause. What are the risks of screening? The main risk of prostate cancer screening is diagnosing and treating prostate cancer that would never have caused any symptoms or problems (overdiagnosis and overtreatment). PSA screening cannot tell you if your PSA is high due to cancer or a different cause. A prostate biopsy is the only procedure to diagnose prostate cancer. Even the results of a biopsy may not tell you if your cancer needs to be treated. Slow-growing prostate cancer may not need any treatment other than monitoring, so diagnosing and treating it may cause unnecessary stress or other side effects. A prostate biopsy may also cause:  Infection or fever.  A false negative. This is a result that shows that you do not have prostate cancer when you actually do have prostate cancer. Questions   to ask your health care provider  When should I start prostate cancer screening?  What is my risk for prostate cancer?  How often do I need screening?  What type of screening tests do I need?  How do I get my test results?  What do my results mean?  Do I need treatment? Contact a health care provider if:  You have difficulty urinating.  You have pain when you urinate or ejaculate.  You have  blood in your urine or semen.  You have pain in your back or in the area of your prostate.  You have trouble getting or maintaining an erection (erectile dysfunction, ED). Summary  Prostate cancer is a common type of cancer in men. The prostate (prostate gland) is located below the bladder and in front of the rectum. This gland adds fluid to semen during ejaculation.  Prostate cancer screening may identify cancer at an early stage, when the cancer can be treated more easily.  The prostate-specific antigen (PSA) test is the recommended screening test for prostate cancer.  Discuss the risks and benefits of prostate cancer screening with your health care provider. If you are age 15 or older, screening is likely to lead to more risks than benefits (risks outweigh the benefits). This information is not intended to replace advice given to you by your health care provider. Make sure you discuss any questions you have with your health care provider. Document Released: 03/04/2017 Document Revised: 05/06/2017 Document Reviewed: 03/04/2017 Elsevier Patient Education  2020 [place].   Prostatitis  Prostatitis is swelling or inflammation of the prostate gland. The prostate is a walnut-sized gland that is involved in the production of semen. It is located below a man's bladder, in front of the rectum. There are four types of prostatitis:  Chronic nonbacterial prostatitis. This is the most common type of prostatitis. It may be associated with a viral infection or autoimmune disorder.  Acute bacterial prostatitis. This is the least common type of prostatitis. It starts quickly and is usually associated with a bladder infection, high fever, and shaking chills. It can occur at any age.  Chronic bacterial prostatitis. This type usually results from acute bacterial prostatitis that happens repeatedly (is recurrent) or has not been treated properly. It can occur in men of any age but is most common among  middle-aged men whose prostate has begun to get larger. The symptoms are not as severe as symptoms caused by acute bacterial prostatitis.  Prostatodynia or chronic pelvic pain syndrome (CPPS). This type is also called pelvic floor disorder. It is associated with increased muscular tone in the pelvis surrounding the prostate. What are the causes? Bacterial prostatitis is caused by infection from bacteria. Chronic nonbacterial prostatitis may be caused by:  Urinary tract infections (UTIs).  Nerve damage.  A response by the body's disease-fighting system (autoimmune response).  Chemicals in the urine. The causes of the other types of prostatitis are usually not known. What are the signs or symptoms? Symptoms of this condition vary depending upon the type of prostatitis. If you have acute bacterial prostatitis, you may experience:  Urinary symptoms, such as: ? Painful urination. ? Burning during urination. ? Frequent and sudden urges to urinate. ? Inability to start urinating. ? A weak or interrupted stream of urine.  Vomiting.  Nausea.  Fever.  Chills.  Inability to empty the bladder completely.  Pain in the: ? Muscles or joints. ? Lower back. ? Lower abdomen. If you have any of the other  types of prostatitis, you may experience:  Urinary symptoms, such as: ? Sudden urges to urinate. ? Frequent urination. ? Difficulty starting urination. ? Weak urine stream. ? Dribbling after urination.  Discharge from the urethra. The urethra is a tube that opens at the end of the penis.  Pain in the: ? Testicles. ? Penis or tip of the penis. ? Rectum. ? Area in front of the rectum and below the scrotum (perineum).  Problems with sexual function.  Painful ejaculation.  Bloody semen. How is this diagnosed? This condition may be diagnosed based on:  A physical and medical exam.  Your symptoms.  A urine test to check for bacteria.  An exam in which a health care  provider uses a finger to feel the prostate (digital rectal exam).  A test of a sample of semen.  Blood tests.  Ultrasound.  Removal of prostate tissue to be examined under a microscope (biopsy).  Tests to check how your body handles urine (urodynamic tests).  A test to look inside your bladder or urethra (cystoscopy). How is this treated? Treatment for this condition depends on the type of prostatitis. Treatment may involve:  Medicines to relieve pain or inflammation.  Medicines to help relax your muscles.  Physical therapy.  Heat therapy.  Techniques to help you control certain body functions (biofeedback).  Relaxation exercises.  Antibiotic medicine, if your condition is caused by bacteria.  Warm water baths (sitz baths). Sitz baths help with relaxing your pelvic floor muscles, which helps to relieve pressure on the prostate. Follow these instructions at home:   Take over-the-counter and prescription medicines only as told by your health care provider.  If you were prescribed an antibiotic, take it as told by your health care provider. Do not stop taking the antibiotic even if you start to feel better.  If physical therapy, biofeedback, or relaxation exercises were prescribed, do exercises as instructed.  Take sitz baths as directed by your health care provider. For a sitz bath, sit in warm water that is deep enough to cover your hips and buttocks.  Keep all follow-up visits as told by your health care provider. This is important. Contact a health care provider if:  Your symptoms get worse.  You have a fever. Get help right away if:  You have chills.  You feel nauseous.  You vomit.  You feel light-headed or feel like you are going to faint.  You are unable to urinate.  You have blood or blood clots in your urine. This information is not intended to replace advice given to you by your health care provider. Make sure you discuss any questions you have  with your health care provider. Document Released: 05/21/2000 Document Revised: 08/06/2017 Document Reviewed: 02/12/2016 Elsevier Patient Education  2020 [employer]

## 2019-01-31 ENCOUNTER — Telehealth: Payer: Self-pay

## 2019-01-31 DIAGNOSIS — R972 Elevated prostate specific antigen [PSA]: Secondary | ICD-10-CM

## 2019-01-31 LAB — PSA, TOTAL AND FREE
PSA, Free Pct: 16.5 %
PSA, Free: 0.66 ng/mL
Prostate Specific Ag, Serum: 4 ng/mL (ref 0.0–4.0)

## 2019-01-31 NOTE — Telephone Encounter (Signed)
mychart notification sent, psa ordered please schedule lab visit

## 2019-01-31 NOTE — Telephone Encounter (Signed)
-----   Message from Billey Co, MD sent at 01/31/2019 12:46 PM EDT ----- PSA came down nicely to 4 from 8.1 when he had prostatitis, he should repeat PSA in 2 months and I will call with results. If normalized, can continue yearly PSA screening with PCP. Can take a few months for PSA to normalize after significant prostatitis.  Nickolas Madrid, MD 01/31/2019

## 2019-02-02 DIAGNOSIS — M1712 Unilateral primary osteoarthritis, left knee: Secondary | ICD-10-CM | POA: Insufficient documentation

## 2019-02-02 DIAGNOSIS — Z86711 Personal history of pulmonary embolism: Secondary | ICD-10-CM | POA: Insufficient documentation

## 2019-02-02 DIAGNOSIS — K219 Gastro-esophageal reflux disease without esophagitis: Secondary | ICD-10-CM | POA: Insufficient documentation

## 2019-02-02 NOTE — Telephone Encounter (Signed)
Appt made and pt notified via Mychart

## 2019-02-03 MED ORDER — LACTATED RINGERS IV SOLN
INTRAVENOUS | Status: DC
Start: ? — End: 2019-02-03

## 2019-02-03 MED ORDER — Medication
0.10 | Status: DC
Start: 2019-02-03 — End: 2019-02-03

## 2019-02-03 MED ORDER — INDOMETHACIN 50 MG PO CAPS
50.00 | ORAL_CAPSULE | ORAL | Status: DC
Start: 2019-02-03 — End: 2019-02-03

## 2019-02-03 MED ORDER — LOVENOX 150 MG/ML ~~LOC~~ SOLN
1.00 | SUBCUTANEOUS | Status: DC
Start: ? — End: 2019-02-03

## 2019-02-03 MED ORDER — Medication
5.00 | Status: DC
Start: ? — End: 2019-02-03

## 2019-02-03 MED ORDER — PANTOPRAZOLE SODIUM 40 MG PO TBEC
40.00 | DELAYED_RELEASE_TABLET | ORAL | Status: DC
Start: 2019-02-04 — End: 2019-02-03

## 2019-02-03 MED ORDER — SENNOSIDES-DOCUSATE SODIUM 8.6-50 MG PO TABS
2.00 | ORAL_TABLET | ORAL | Status: DC
Start: 2019-02-03 — End: 2019-02-03

## 2019-02-03 MED ORDER — RIVAROXABAN 10 MG PO TABS
10.00 | ORAL_TABLET | ORAL | Status: DC
Start: 2019-02-03 — End: 2019-02-03

## 2019-02-03 MED ORDER — TUSSI PRES-B 2-15-200 MG/5ML PO LIQD
0.50 | ORAL | Status: DC
Start: ? — End: 2019-02-03

## 2019-02-03 MED ORDER — POLYETHYLENE GLYCOL 3350 17 G PO PACK
17.00 | PACK | ORAL | Status: DC
Start: 2019-02-04 — End: 2019-02-03

## 2019-02-03 MED ORDER — ACETAMINOPHEN 325 MG PO TABS
975.00 | ORAL_TABLET | ORAL | Status: DC
Start: 2019-02-03 — End: 2019-02-03

## 2019-02-03 MED ORDER — PROMETHAZINE HCL (BULK CHEMICALS - P'S)
25.00 | Status: DC
Start: ? — End: 2019-02-03

## 2019-02-03 MED ORDER — Medication
Status: DC
Start: ? — End: 2019-02-03

## 2019-02-03 MED ORDER — DICLOXACILLIN SODIUM 62.5 MG/5ML PO SUSR
10.00 | ORAL | Status: DC
Start: ? — End: 2019-02-03

## 2019-02-06 ENCOUNTER — Other Ambulatory Visit: Payer: PRIVATE HEALTH INSURANCE

## 2019-02-19 DIAGNOSIS — M25861 Other specified joint disorders, right knee: Secondary | ICD-10-CM | POA: Insufficient documentation

## 2019-03-19 ENCOUNTER — Encounter: Payer: Self-pay | Admitting: Family Medicine

## 2019-03-19 ENCOUNTER — Other Ambulatory Visit: Payer: Self-pay | Admitting: Family Medicine

## 2019-03-19 DIAGNOSIS — M172 Bilateral post-traumatic osteoarthritis of knee: Secondary | ICD-10-CM

## 2019-03-19 MED ORDER — IBUPROFEN 800 MG PO TABS: 800.0000 mg | ORAL_TABLET | Freq: Three times a day (TID) | ORAL | 1 refills | Status: DC | PRN

## 2019-03-19 MED ORDER — OMEPRAZOLE MAGNESIUM 20 MG PO TBEC: 20.0000 mg | DELAYED_RELEASE_TABLET | Freq: Every day | ORAL | 5 refills | Status: DC | PRN

## 2019-03-19 NOTE — Progress Notes (Signed)
Patient ID: Ronnie Carey, male   DOB: 01-31-1956, 63 y.o.   MRN: [id number]   Patient requests refill of ibuprofen and omeprazole per message in My Chart.  He reports that he had been told to hold these medications by his orthopedic surgeon but may now resume their use

## 2019-04-09 ENCOUNTER — Other Ambulatory Visit: Payer: PRIVATE HEALTH INSURANCE

## 2019-04-09 ENCOUNTER — Other Ambulatory Visit: Payer: Self-pay

## 2019-04-09 DIAGNOSIS — R972 Elevated prostate specific antigen [PSA]: Secondary | ICD-10-CM

## 2019-04-10 LAB — PSA: Prostate Specific Ag, Serum: 3.7 ng/mL (ref 0.0–4.0)

## 2019-05-17 ENCOUNTER — Encounter: Payer: Self-pay | Admitting: Family Medicine

## 2019-05-17 NOTE — Telephone Encounter (Signed)
Please fill medications for patient if appropriate

## 2019-05-18 ENCOUNTER — Other Ambulatory Visit (HOSPITAL_BASED_OUTPATIENT_CLINIC_OR_DEPARTMENT_OTHER): Payer: PRIVATE HEALTH INSURANCE | Admitting: Family Medicine

## 2019-05-18 DIAGNOSIS — M172 Bilateral post-traumatic osteoarthritis of knee: Secondary | ICD-10-CM

## 2019-05-18 DIAGNOSIS — Z791 Long term (current) use of non-steroidal anti-inflammatories (NSAID): Secondary | ICD-10-CM | POA: Diagnosis not present

## 2019-05-18 MED ORDER — OMEPRAZOLE 20 MG PO CPDR: 20.0000 mg | DELAYED_RELEASE_CAPSULE | Freq: Every day | ORAL | 0 refills | Status: DC

## 2019-05-18 MED ORDER — IBUPROFEN 800 MG PO TABS: 800.0000 mg | ORAL_TABLET | Freq: Three times a day (TID) | ORAL | 0 refills | Status: DC | PRN

## 2019-05-18 MED ORDER — TRIAMCINOLONE ACETONIDE 0.5 % EX CREA: 1.0000 "application " | TOPICAL_CREAM | Freq: Three times a day (TID) | CUTANEOUS | 0 refills | Status: DC

## 2019-05-18 NOTE — Progress Notes (Signed)
Patient ID: Ronnie Carey, male   DOB: Mar 16, 1956, 63 y.o.   MRN: [id number]   Message received from patient regarding refills for ibuprofen, omeprazole and triamcinolone cream.  30-day supplies will be sent to patient's pharmacy and he will be notified to schedule follow-up appointment

## 2019-08-12 ENCOUNTER — Other Ambulatory Visit: Payer: Self-pay | Admitting: Family Medicine

## 2019-08-12 DIAGNOSIS — Z791 Long term (current) use of non-steroidal anti-inflammatories (NSAID): Secondary | ICD-10-CM

## 2019-09-04 NOTE — Telephone Encounter (Signed)
disregard

## 2020-03-20 DIAGNOSIS — Z96651 Presence of right artificial knee joint: Secondary | ICD-10-CM | POA: Diagnosis not present

## 2020-03-20 DIAGNOSIS — Z96652 Presence of left artificial knee joint: Secondary | ICD-10-CM | POA: Diagnosis not present

## 2020-03-20 DIAGNOSIS — M25861 Other specified joint disorders, right knee: Secondary | ICD-10-CM | POA: Diagnosis not present

## 2020-03-20 DIAGNOSIS — Z96653 Presence of artificial knee joint, bilateral: Secondary | ICD-10-CM | POA: Diagnosis not present

## 2020-10-13 ENCOUNTER — Encounter (HOSPITAL_COMMUNITY): Payer: Self-pay | Admitting: Emergency Medicine

## 2020-10-13 ENCOUNTER — Other Ambulatory Visit: Payer: Self-pay

## 2020-10-13 ENCOUNTER — Inpatient Hospital Stay (HOSPITAL_COMMUNITY)
Admission: EM | Admit: 2020-10-13 | Discharge: 2020-10-16 | DRG: 176 | Disposition: A | Discharge status: Home / Self Care | Payer: Medicaid Other | Attending: Internal Medicine | Admitting: Internal Medicine

## 2020-10-13 ENCOUNTER — Emergency Department (HOSPITAL_COMMUNITY): Payer: Medicaid Other

## 2020-10-13 DIAGNOSIS — M170 Bilateral primary osteoarthritis of knee: Secondary | ICD-10-CM | POA: Diagnosis present

## 2020-10-13 DIAGNOSIS — Z79899 Other long term (current) drug therapy: Secondary | ICD-10-CM | POA: Diagnosis not present

## 2020-10-13 DIAGNOSIS — I2694 Multiple subsegmental pulmonary emboli without acute cor pulmonale: Secondary | ICD-10-CM | POA: Diagnosis not present

## 2020-10-13 DIAGNOSIS — Z96651 Presence of right artificial knee joint: Secondary | ICD-10-CM | POA: Diagnosis present

## 2020-10-13 DIAGNOSIS — R0902 Hypoxemia: Secondary | ICD-10-CM | POA: Diagnosis present

## 2020-10-13 DIAGNOSIS — Z86718 Personal history of other venous thrombosis and embolism: Secondary | ICD-10-CM

## 2020-10-13 DIAGNOSIS — Z86711 Personal history of pulmonary embolism: Secondary | ICD-10-CM

## 2020-10-13 DIAGNOSIS — R609 Edema, unspecified: Secondary | ICD-10-CM | POA: Diagnosis not present

## 2020-10-13 DIAGNOSIS — I2609 Other pulmonary embolism with acute cor pulmonale: Secondary | ICD-10-CM | POA: Diagnosis not present

## 2020-10-13 DIAGNOSIS — E669 Obesity, unspecified: Secondary | ICD-10-CM | POA: Diagnosis present

## 2020-10-13 DIAGNOSIS — Z6833 Body mass index (BMI) 33.0-33.9, adult: Secondary | ICD-10-CM | POA: Diagnosis not present

## 2020-10-13 DIAGNOSIS — I82432 Acute embolism and thrombosis of left popliteal vein: Secondary | ICD-10-CM | POA: Diagnosis present

## 2020-10-13 DIAGNOSIS — R000 Tachycardia, unspecified: Secondary | ICD-10-CM | POA: Diagnosis not present

## 2020-10-13 DIAGNOSIS — I2699 Other pulmonary embolism without acute cor pulmonale: Secondary | ICD-10-CM | POA: Diagnosis present

## 2020-10-13 DIAGNOSIS — R0602 Shortness of breath: Secondary | ICD-10-CM | POA: Diagnosis not present

## 2020-10-13 DIAGNOSIS — I2692 Saddle embolus of pulmonary artery without acute cor pulmonale: Principal | ICD-10-CM | POA: Diagnosis present

## 2020-10-13 DIAGNOSIS — R079 Chest pain, unspecified: Secondary | ICD-10-CM | POA: Diagnosis not present

## 2020-10-13 DIAGNOSIS — Z20822 Contact with and (suspected) exposure to covid-19: Secondary | ICD-10-CM | POA: Diagnosis present

## 2020-10-13 DIAGNOSIS — R42 Dizziness and giddiness: Secondary | ICD-10-CM | POA: Diagnosis not present

## 2020-10-13 LAB — CBC WITH DIFFERENTIAL/PLATELET
Abs Immature Granulocytes: 0.06 10*3/uL (ref 0.00–0.07)
Basophils Absolute: 0.1 10*3/uL (ref 0.0–0.1)
Basophils Relative: 0 %
Eosinophils Absolute: 0.4 10*3/uL (ref 0.0–0.5)
Eosinophils Relative: 3 %
HCT: 46.7 % (ref 39.0–52.0)
Hemoglobin: 15.8 g/dL (ref 13.0–17.0)
Immature Granulocytes: 0 %
Lymphocytes Relative: 10 %
Lymphs Abs: 1.3 10*3/uL (ref 0.7–4.0)
MCH: 30.3 pg (ref 26.0–34.0)
MCHC: 33.8 g/dL (ref 30.0–36.0)
MCV: 89.5 fL (ref 80.0–100.0)
Monocytes Absolute: 1.2 10*3/uL — ABNORMAL HIGH (ref 0.1–1.0)
Monocytes Relative: 9 %
Neutro Abs: 10.6 10*3/uL — ABNORMAL HIGH (ref 1.7–7.7)
Neutrophils Relative %: 78 %
Platelets: 240 10*3/uL (ref 150–400)
RBC: 5.22 MIL/uL (ref 4.22–5.81)
RDW: 12.6 % (ref 11.5–15.5)
WBC: 13.6 10*3/uL — ABNORMAL HIGH (ref 4.0–10.5)
nRBC: 0 % (ref 0.0–0.2)

## 2020-10-13 LAB — COMPREHENSIVE METABOLIC PANEL
ALT: 25 U/L (ref 0–44)
AST: 20 U/L (ref 15–41)
Albumin: 4.1 g/dL (ref 3.5–5.0)
Alkaline Phosphatase: 81 U/L (ref 38–126)
Anion gap: 9 (ref 5–15)
BUN: 14 mg/dL (ref 8–23)
CO2: 23 mmol/L (ref 22–32)
Calcium: 9.2 mg/dL (ref 8.9–10.3)
Chloride: 104 mmol/L (ref 98–111)
Creatinine, Ser: 1.26 mg/dL — ABNORMAL HIGH (ref 0.61–1.24)
GFR, Estimated: >60 mL/min (ref >60)
Glucose, Bld: 132 mg/dL — ABNORMAL HIGH (ref 70–99)
Potassium: 4 mmol/L (ref 3.5–5.1)
Sodium: 136 mmol/L (ref 135–145)
Total Bilirubin: 0.8 mg/dL (ref 0.3–1.2)
Total Protein: 7.5 g/dL (ref 6.5–8.1)

## 2020-10-13 LAB — BRAIN NATRIURETIC PEPTIDE: B Natriuretic Peptide: 216.1 pg/mL — ABNORMAL HIGH (ref 0.0–100.0)

## 2020-10-13 LAB — TROPONIN I (HIGH SENSITIVITY)
Troponin I (High Sensitivity): 454 ng/L (ref <18)
Troponin I (High Sensitivity): 584 ng/L (ref <18)

## 2020-10-13 LAB — RESP PANEL BY RT-PCR (FLU A&B, COVID) ARPGX2
Influenza A by PCR: NEGATIVE
Influenza B by PCR: NEGATIVE
SARS Coronavirus 2 by RT PCR: NEGATIVE

## 2020-10-13 MED ORDER — POLYETHYLENE GLYCOL 3350 17 G PO PACK: 17.0000 g | PACK | Freq: Every day | ORAL | Status: DC | PRN

## 2020-10-13 MED ORDER — PANTOPRAZOLE SODIUM 40 MG IV SOLR
40.0000 mg | Freq: Every day | INTRAVENOUS | Status: DC
  Administered 2020-10-13 – 2020-10-14 (×2): 40 mg via INTRAVENOUS
  Filled 2020-10-13 (×2): qty 40

## 2020-10-13 MED ORDER — DOCUSATE SODIUM 100 MG PO CAPS: 100.0000 mg | ORAL_CAPSULE | Freq: Two times a day (BID) | ORAL | Status: DC | PRN

## 2020-10-13 MED ORDER — HEPARIN (PORCINE) 25000 UT/250ML-% IV SOLN
1900.0000 [IU]/h | INTRAVENOUS | Status: DC
  Administered 2020-10-13 – 2020-10-14 (×3): 1800 [IU]/h via INTRAVENOUS
  Filled 2020-10-13 (×3): qty 250

## 2020-10-13 MED ORDER — ONDANSETRON HCL 4 MG/2ML IJ SOLN: 4.0000 mg | Freq: Four times a day (QID) | INTRAMUSCULAR | Status: DC | PRN

## 2020-10-13 MED ORDER — IOHEXOL 350 MG/ML SOLN
60.0000 mL | Freq: Once | INTRAVENOUS | Status: AC | PRN
  Administered 2020-10-13: 60 mL via INTRAVENOUS

## 2020-10-13 MED ORDER — HEPARIN BOLUS VIA INFUSION
5000.0000 [IU] | Freq: Once | INTRAVENOUS | Status: AC
  Administered 2020-10-13: 5000 [IU] via INTRAVENOUS
  Filled 2020-10-13: qty 5000

## 2020-10-13 NOTE — H&P (Signed)
NAME:  Ronnie Carey, MRN:  [id number], DOB:  [birth year], LOS: 0 ADMISSION DATE:  10/13/2020  History of Present Illness:  This is a 65 year old white male that presented to the emergency room from home.  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  Patient states he has been more sedentary and had an issue with sciatica approximately 4 weeks ago.  He noticed his left leg swelling more than the right and also being very sore.  He states he felt that pain get immediately better at the same time he developed acute onset shortness of breath.  He also experienced some substernal chest pain.  He recognized his symptoms but had things to do at his house before he came to the emergency room.  Symptoms were progressive through the day today.  He had a near syncopal episode while attempting to walk to the bathroom and then 911 was called.  Pulmonary embolism risk factors No hemoptysis No history of cancer No history of congestive heart failure Oxygen saturation is less than 90% on room air in the emergency room. MAP was greater than 65 in the emergency room Heart rate less than 110   Pertinent  Medical History  DVT Pulmonary embolism Obesity Osteoarthritis of the knees Small bowel obstruction Incarcerated umbilical hernia  Significant Hospital Events: Including procedures, antibiotic start and stop dates in addition to other pertinent events   .     Objective   Blood pressure 110/83, pulse (!) 113, temperature (!) 97 F (36.1 C), temperature source Oral, resp. rate (!) 21, height 5\' 11"  (1.803 m), weight 108.9 kg, SpO2 98 %.       No intake or output data in the 24 hours ending 10/13/20 2316 Filed Weights   10/13/20 1842  Weight: 108.9 kg    Examination: General: No acute distress HENT: Atraumatic/normocephalic mucous membranes are moist Lungs: Clear to auscultation bilaterally no wheezing rales or rhonchi  noted. Cardiovascular: Regular rate no murmur rub or gallop appreciated Abdomen: Abdomen soft, nontender, nondistended, positive bowel sounds.  No rebound/rigidity/guarding. Extremities: Distal pulse intact x4.  No significant edema specifically of the lower extremities.  No cords noted.  Mild tenderness of the left calf muscle. Neuro: Conscious alert and orient x3.  Motor is grossly intact x4 extremities.  No focal neurologic deficits.   Labs/imaging that I havepersonally reviewed  (right click and "Reselect all SmartList Selections" daily)      Assessment & Plan:  Saddle pulmonary embolism Right ventricular strain with dilatation Suspect DVT of the lower extremity Obesity  PESI score: 94 points intermediate risk  Plan: Patient's been started on heparin infusion for systemic anticoagulation Patient was assessed for emergent thrombolytic therapy at this time he does not meet criteria. Will consult interventional radiology for localized lytic therapy versus mechanical thrombectomy. Lower extremity Dopplers have been ordered. Maintain four peripheral IVs Wean FiO2 for oxygen saturations greater 90%  Best practice (right click and "Reselect all SmartList Selections" daily)  Diet:  NPO Pain/Anxiety/Delirium protocol (if indicated): No VAP protocol (if indicated): Not indicated DVT prophylaxis: Systemic AC GI prophylaxis: PPI Glucose control: Monitor blood sugar Central venous access:  N/A Arterial line:  N/A Foley:  N/A Mobility:  bed rest  PT consulted: N/A Last date of multidisciplinary goals of care discussion [ Code Status:  full code Disposition: Admit to the intensive care unit  Labs   CBC: Recent Labs  Lab 10/13/20 1845  WBC 13.6*  NEUTROABS 10.6*  HGB 15.8  HCT 46.7  MCV 89.5  PLT 240    Basic Metabolic Panel: Recent Labs  Lab 10/13/20 1845  NA 136  K 4.0  CL 104  CO2 23  GLUCOSE 132*  BUN 14  CREATININE 1.26*  CALCIUM 9.2   GFR: Estimated  Creatinine Clearance: 74.3 mL/min (A) (by C-G formula based on SCr of 1.26 mg/dL (H)). Recent Labs  Lab 10/13/20 1845  WBC 13.6*    Liver Function Tests: Recent Labs  Lab 10/13/20 1845  AST 20  ALT 25  ALKPHOS 81  BILITOT 0.8  PROT 7.5  ALBUMIN 4.1   No results for input(s): LIPASE, AMYLASE in the last 168 hours. No results for input(s): AMMONIA in the last 168 hours.  ABG No results found for: PHART, PCO2ART, PO2ART, HCO3, TCO2, ACIDBASEDEF, O2SAT   Coagulation Profile: No results for input(s): INR, PROTIME in the last 168 hours.  Cardiac Enzymes: No results for input(s): CKTOTAL, CKMB, CKMBINDEX, TROPONINI in the last 168 hours.  HbA1C: No results found for: HGBA1C  CBG: No results for input(s): GLUCAP in the last 168 hours.  Review of Systems:   See HPI   Past Medical History:  He,  has a past medical history of History of pulmonary embolus (PE) and SBO (small bowel obstruction) (HCC) (05/2018).   Surgical History:   Past Surgical History:  Procedure Laterality Date  . TONSILLECTOMY    . TOTAL KNEE ARTHROPLASTY Right 2012     Social History:   reports that he has never smoked. He has never used smokeless tobacco. He reports that he does not drink alcohol and does not use drugs.   Family History:  His family history is not on file.   Allergies No Known Allergies   Home Medications  Prior to Admission medications   Medication Sig Start Date End Date Taking? Authorizing Provider  omeprazole (PRILOSEC) 20 MG capsule Take 1 capsule (20 mg total) by mouth daily. To reduce stomach acid 05/18/19  Yes Fulp, Cammie, MD     Critical care time: 

## 2020-10-13 NOTE — ED Notes (Signed)
EDP notified on elevated Troponin.

## 2020-10-13 NOTE — ED Provider Notes (Signed)
This patient is a 65 year old male, presents to the hospital today with a complaint of difficulty breathing and chest pain which started last night, on exam the patient is tachycardic hypoxic complains of pain and swelling in his left leg he states this is chronic since having a DVT and a pulmonary embolism approximately a decade ago.  He takes no daily medications, paramedics found the patient to be tachycardic and hypoxic and placed him on a nonrebreather.  Prehospital cardiac tracings revealed diffuse ST depression.  On my exam the patient is tachycardic, he is tachypneic, he has no significant asymmetry of the legs, he has a soft abdomen, his lung sounds are overall clear but he is speaking in just shortened sentences.  His EKG here shows sinus tachycardia with diffuse ST depression in almost all leads.  He has a normal axis, there is no signs of ST elevation, no signs of left ventricular hypertrophy.  This patient is critically ill, I suspect this is related to a pulmonary embolism but will need a work-up including chest x-ray labs and a possible CT scan.  The patient is agreeable to the plan.  Discussed care with the intensivist, they will come see the patient for ICU placement.  He does have an elevated troponin, severe right heart strain, massive pulmonary embolism with saddle component and significant right heart strain. On heparin - may benefit from TPA  .Critical Care Performed by: Eber Hong, MD Authorized by: Eber Hong, MD   Critical care provider statement:    Critical care time (minutes):  35   Critical care time was exclusive of:  Separately billable procedures and treating other patients and teaching time   Critical care was necessary to treat or prevent imminent or life-threatening deterioration of the following conditions:  Respiratory failure and circulatory failure   Critical care was time spent personally by me on the following activities:  Blood draw for specimens,  development of treatment plan with patient or surrogate, discussions with consultants, evaluation of patient's response to treatment, examination of patient, obtaining history from patient or surrogate, ordering and performing treatments and interventions, ordering and review of laboratory studies, ordering and review of radiographic studies, pulse oximetry, re-evaluation of patient's condition and review of old charts   Final diagnoses:  Acute massive pulmonary embolism (HCC)       Eber Hong, MD 10/14/20 1521

## 2020-10-13 NOTE — ED Notes (Signed)
Patient transported to CT scan . 

## 2020-10-13 NOTE — ED Provider Notes (Signed)
[facility] EMERGENCY DEPARTMENT Provider Note   CSN: [id number] Arrival date & time: 10/13/20  1839     History Chief Complaint  Patient presents with  . Shortness of Breath    PE    Ronnie Carey is a 65 y.o. male.  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  Patient states that started last night.  He states he felt like something went through his heart and then had a small amount of right-sided chest pain.  Then became significantly short of breath.  This has been constant and worsening since onset.  Exertion makes it worse, rest makes it better.  States it is exactly the same presentation as his last DVT/PE.  No new swelling or pain in the back of his left leg, but feels like that is where his DVT is.  Has been taking aspirin for the symptoms to try and thin his blood without improvement.  He called EMS, oxygen was applied as he was found to be hypoxic in the low 80s.  This has improved symptoms.  States that his shortness of breath is severe.  Does have occasional left-sided pleuritic chest pain.  He denies recent surgeries or hospitalization, but does state that he is getting over a bout of sciatica during which she was significantly less mobile.  No travel or hormone use.    Past Medical History:  Diagnosis Date  . History of pulmonary embolus (PE)   . SBO (small bowel obstruction) (HCC) 05/2018    Patient Active Problem List   Diagnosis Date Noted  . Unilateral primary osteoarthritis, left knee 08/29/2018  . Incarcerated umbilical hernia 08/24/2018  . Osteoarthritis 06/07/2008    Past Surgical History:  Procedure Laterality Date  . TONSILLECTOMY    . TOTAL KNEE ARTHROPLASTY Right 2012       History reviewed. No pertinent family history.  Social History   Tobacco Use  . Smoking status: Never Smoker  . Smokeless tobacco: Never Used  Vaping Use  . Vaping Use: Never used  Substance  Use Topics  . Alcohol use: Never  . Drug use: Never    Home Medications Prior to Admission medications   Medication Sig Start Date End Date Taking? Authorizing Provider  ibuprofen (ADVIL) 800 MG tablet Take 1 tablet (800 mg total) by mouth every 8 (eight) hours as needed for moderate pain. Eat prior to taking the [facility] 05/18/19   Fulp, Cammie, MD  omeprazole (PRILOSEC) 20 MG capsule Take 1 capsule (20 mg total) by mouth daily. To reduce stomach acid 05/18/19   Fulp, Cammie, MD  triamcinolone cream (KENALOG) 0.5 % Apply 1 application topically 3 (three) times daily. 05/18/19   Cain Saupe, MD    Allergies    Patient has no known allergies.  Review of Systems   Review of Systems  Constitutional: Negative for chills and fever.  HENT: Negative for ear pain and sore throat.   Eyes: Negative for pain and visual disturbance.  Respiratory: Positive for shortness of breath. Negative for cough.   Cardiovascular: Positive for chest pain. Negative for palpitations.  Gastrointestinal: Negative for abdominal pain and vomiting.  Endocrine: Negative for polydipsia.  Genitourinary: Negative for dysuria and hematuria.  Musculoskeletal: Negative for arthralgias and back pain.  Skin: Negative for color change and rash.  Neurological: Negative for seizures and syncope.  Psychiatric/Behavioral: Negative for behavioral problems.  All other systems reviewed and are negative.   Physical Exam  Updated Vital Signs BP 111/70   Pulse (!) 117   Temp (!) 97 F (36.1 C) (Oral)   Resp (!) 21   Ht 5\' 11"  (1.803 m)   Wt 108.9 kg   SpO2 97%   BMI 33.47 kg/m   Physical Exam Vitals and nursing note reviewed.  Constitutional:      Appearance: He is ill-appearing.  HENT:     Head: Normocephalic and atraumatic.  Eyes:     Extraocular Movements: Extraocular movements intact.     Conjunctiva/sclera: Conjunctivae normal.  Cardiovascular:     Rate and Rhythm: Regular rhythm. Tachycardia present.      Heart sounds: No murmur heard.   Pulmonary:     Effort: Tachypnea and respiratory distress present.     Breath sounds: Normal breath sounds.  Chest:     Chest wall: No tenderness.  Abdominal:     Palpations: Abdomen is soft.     Tenderness: There is no abdominal tenderness. There is no guarding.  Musculoskeletal:     Cervical back: Neck supple.     Right lower leg: No tenderness. No edema.     Left lower leg: No tenderness. No edema.  Skin:    General: Skin is warm and dry.     Capillary Refill: Capillary refill takes less than 2 seconds.  Neurological:     General: No focal deficit present.     Mental Status: He is alert and oriented to person, place, and time.  Psychiatric:        Mood and Affect: Mood normal.        Behavior: Behavior normal.     ED Results / Procedures / Treatments   Labs (all labs ordered are listed, but only abnormal results are displayed) Labs Reviewed  CBC WITH DIFFERENTIAL/PLATELET - Abnormal; Notable for the following components:      Result Value   WBC 13.6 (*)    Neutro Abs 10.6 (*)    Monocytes Absolute 1.2 (*)    All other components within normal limits  COMPREHENSIVE METABOLIC PANEL - Abnormal; Notable for the following components:   Glucose, Bld 132 (*)    Creatinine, Ser 1.26 (*)    All other components within normal limits  BRAIN NATRIURETIC PEPTIDE - Abnormal; Notable for the following components:   B Natriuretic Peptide 216.1 (*)    All other components within normal limits  TROPONIN I (HIGH SENSITIVITY) - Abnormal; Notable for the following components:   Troponin I (High Sensitivity) 454 (*)    All other components within normal limits  RESP PANEL BY RT-PCR (FLU A&B, COVID) ARPGX2  HEPARIN LEVEL (UNFRACTIONATED)  CBC  TROPONIN I (HIGH SENSITIVITY)    EKG EKG Interpretation  Date/Time:  Monday Oct 13 2020 18:44:14 EDT Ventricular Rate:  116 PR Interval:    QRS Duration: 99 QT Interval:  299 QTC Calculation: 416 R  Axis:   56 Text Interpretation: Atrial fibrillation RSR' in V1 or V2, right VCD or RVH Repol abnrm, severe global ischemia (LM/MVD) Since last tracing ST depression now seen diffusely Confirmed by Eber HongMiller, Brian ([id number]) on 10/13/2020 6:53:36 PM   Radiology CT Angio Chest PE W and/or Wo Contrast  Result Date: 10/13/2020 CLINICAL DATA:  Shortness of breath EXAM: CT ANGIOGRAPHY CHEST WITH CONTRAST TECHNIQUE: Multidetector CT imaging of the chest was performed using the standard protocol during bolus administration of intravenous contrast. Multiplanar CT image reconstructions and MIPs were obtained to evaluate the vascular anatomy. CONTRAST:  60mL OMNIPAQUE IOHEXOL  350 MG/ML SOLN COMPARISON:  Chest x-ray from earlier in the same day. FINDINGS: Cardiovascular: Thoracic aorta shows mild atherosclerotic change without aneurysmal dilatation or dissection. Pulmonary artery is prominent and demonstrates significant bilateral pulmonary emboli with saddle component bridging the bifurcation of the pulmonary artery. The RV/LV ratio is calculated at 3. Increased central venous pressure is noted with collateralization in the mediastinum. Mild coronary calcifications are noted. Mediastinum/Nodes: Thoracic inlet is within normal limits. No sizable hilar or mediastinal adenopathy is noted. The esophagus as visualized is within normal limits. Lungs/Pleura: Lungs are well aerated bilaterally. Patchy airspace opacity is noted in the medial aspect of the left upper lobe adjacent to the mediastinum which may represent some focal edema or early pneumonic infiltrate. Upper Abdomen: Visualized upper abdomen shows upper pole cyst in the left kidney. No acute abnormality is noted. Musculoskeletal: Degenerative changes of the thoracic spine are noted. Review of the MIP images confirms the above findings. IMPRESSION: Significant bilateral pulmonary emboli with evidence of saddle component centrally. Elevated RV/LV ratio is noted of 3. Patchy  airspace opacity in the medial aspect of the left upper lobe which may represent edema or early infiltrate. Aortic Atherosclerosis (ICD10-I70.0). Critical Value/emergent results were called by telephone at the time of interpretation on 10/13/2020 at 9:33 pm to Dr. Marylouise Stacks, who verbally acknowledged these results. Electronically Signed   By: Alcide Clever M.D.   On: 10/13/2020 21:37   DG Chest Port 1 View  Result Date: 10/13/2020 CLINICAL DATA:  Chest pain and shortness of breath EXAM: PORTABLE CHEST 1 VIEW COMPARISON:  None. FINDINGS: The heart size and mediastinal contours are within normal limits. Both lungs are clear. The visualized skeletal structures are unremarkable. IMPRESSION: No active disease. Electronically Signed   By: Alcide Clever M.D.   On: 10/13/2020 19:20    Procedures Procedures   Medications Ordered in ED Medications  heparin ADULT infusion 100 units/mL (25000 units/26mL) (1,800 Units/hr Intravenous New Bag/Given 10/13/20 2142)  heparin bolus via infusion 5,000 Units (5,000 Units Intravenous Bolus from Bag 10/13/20 2143)  iohexol (OMNIPAQUE) 350 MG/ML injection 60 mL (60 mLs Intravenous Contrast Given 10/13/20 2123)    ED Course  I have reviewed the triage vital signs and the nursing notes.  Pertinent labs & imaging results that were available during my care of the patient were reviewed by me and considered in my medical decision making (see chart for details).    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  He is hemodynamically stable, but tachycardic upon arrival.  Has nonrebreather on and is satting in the 90s.  Exam shows tachycardic male tachypneic.  Only speaking in short sentences.  Heart without murmur.  Lungs clear to auscultation.  Abdomen soft and nontender.  Bilateral lower extremities not edematous per him compared to previous.  No posterior calf  tenderness.  I am most concerned for recurrent PE in this patient.  High risk [place].  PE ordered.  Additionally diagnosis includes ACS, will trend troponins.  EKG upon arrival shows multiple ST depressions.  Could be rate dependent due to tachycardia, no obvious STEMI.  It is sinus rhythm.  We will also obtain basic labs to further stratify patient.  Patient has elevated troponin.  CT shows saddle pulmonary embolus with significant right ventricular strain.  He mildly elevated.  Treated in the emergency department.  Due to potential for patient decompensation, requires admission to ICU for close monitoring and treatment.  Critical care consulted.  Patient admitted to ICU.  Final Clinical Impression(s) / ED Diagnoses Final diagnoses:  Acute massive pulmonary embolism [facility])    Rx / DC Orders ED Discharge Orders    None       Louretta Parma, DO 10/13/20 2144    Eber Hong, MD 10/14/20 1520

## 2020-10-13 NOTE — ED Triage Notes (Signed)
Patient BIB [facility] from home. Complaint of chest pain and shortness of breath that began last night. Patient with hx of PE 10 years ago, SpO2 84% RA.

## 2020-10-13 NOTE — Progress Notes (Signed)
ANTICOAGULATION CONSULT NOTE - Initial Consult  Pharmacy Consult for heparin Indication: R/o acute PE  No Known Allergies  Patient Measurements: Height: 5\' 11"  (180.3 cm) Weight: 108.9 kg (240 lb) IBW/kg (Calculated) : 75.3  Vital Signs: Temp: 97 F (36.1 C) (05/09 1841) Temp Source: Oral (05/09 1841) BP: 108/81 (05/09 2000) Pulse Rate: 110 (05/09 2000)  Labs: Recent Labs    10/13/20 1845  HGB 15.8  HCT 46.7  PLT 240  CREATININE 1.26*  TROPONINIHS 454*    Estimated Creatinine Clearance: 74.3 mL/min (A) (by C-G formula based on SCr of 1.26 mg/dL (H)).   Medical History: Past Medical History:  Diagnosis Date  . History of pulmonary embolus (PE)   . SBO (small bowel obstruction) (HCC) 05/2018    Medications:  (Not in a hospital admission)   Assessment: 37 YOM who presents with SOB and chest pain found with h/o or PE and DVT found to have elevated cardiac markers. Pharmacy consulted to start IV heparin for r/o PE. Awaiting CT PE.    H/H and Plt wnl. SCr mildly elevated   Goal of Therapy:  Heparin level 0.3-0.7 units/ml Monitor platelets by anticoagulation protocol: Yes   Plan:  -Heparin 5000 units IV bolus followed by heparin infusion at 1800 units/hr  -F/u 6 hr HL -Monitor daily HL, CBC and s/s of bleeding  78, PharmD., BCPS, BCCCP Clinical Pharmacist Please refer to [facility] for unit-specific pharmacist

## 2020-10-14 ENCOUNTER — Other Ambulatory Visit (HOSPITAL_COMMUNITY): Payer: Self-pay

## 2020-10-14 ENCOUNTER — Inpatient Hospital Stay (HOSPITAL_COMMUNITY): Payer: Medicaid Other

## 2020-10-14 DIAGNOSIS — I2692 Saddle embolus of pulmonary artery without acute cor pulmonale: Principal | ICD-10-CM

## 2020-10-14 DIAGNOSIS — I2609 Other pulmonary embolism with acute cor pulmonale: Secondary | ICD-10-CM

## 2020-10-14 DIAGNOSIS — I2699 Other pulmonary embolism without acute cor pulmonale: Secondary | ICD-10-CM

## 2020-10-14 DIAGNOSIS — R609 Edema, unspecified: Secondary | ICD-10-CM

## 2020-10-14 LAB — ECHOCARDIOGRAM COMPLETE
Area-P 1/2: 3.53 cm2
Height: 71 in
S' Lateral: 3.5 cm
Weight: 3841.3 oz

## 2020-10-14 LAB — GLUCOSE, CAPILLARY: Glucose-Capillary: 126 mg/dL — ABNORMAL HIGH (ref 70–99)

## 2020-10-14 LAB — BASIC METABOLIC PANEL
Anion gap: 12 (ref 5–15)
BUN: 13 mg/dL (ref 8–23)
CO2: 22 mmol/L (ref 22–32)
Calcium: 9.1 mg/dL (ref 8.9–10.3)
Chloride: 103 mmol/L (ref 98–111)
Creatinine, Ser: 1.2 mg/dL (ref 0.61–1.24)
GFR, Estimated: >60 mL/min (ref >60)
Glucose, Bld: 134 mg/dL — ABNORMAL HIGH (ref 70–99)
Potassium: 4 mmol/L (ref 3.5–5.1)
Sodium: 137 mmol/L (ref 135–145)

## 2020-10-14 LAB — CBC
HCT: 41.8 % (ref 39.0–52.0)
Hemoglobin: 14.5 g/dL (ref 13.0–17.0)
MCH: 30.7 pg (ref 26.0–34.0)
MCHC: 34.7 g/dL (ref 30.0–36.0)
MCV: 88.4 fL (ref 80.0–100.0)
Platelets: 232 10*3/uL (ref 150–400)
RBC: 4.73 MIL/uL (ref 4.22–5.81)
RDW: 12.7 % (ref 11.5–15.5)
WBC: 11.9 10*3/uL — ABNORMAL HIGH (ref 4.0–10.5)
nRBC: 0 % (ref 0.0–0.2)

## 2020-10-14 LAB — MRSA PCR SCREENING: MRSA by PCR: NEGATIVE

## 2020-10-14 LAB — HIV ANTIBODY (ROUTINE TESTING W REFLEX): HIV Screen 4th Generation wRfx: NONREACTIVE

## 2020-10-14 LAB — HEPARIN LEVEL (UNFRACTIONATED)
Heparin Unfractionated: 0.53 IU/mL (ref 0.30–0.70)
Heparin Unfractionated: 0.49 IU/mL (ref 0.30–0.70)

## 2020-10-14 LAB — PHOSPHORUS: Phosphorus: 3.2 mg/dL (ref 2.5–4.6)

## 2020-10-14 LAB — TROPONIN I (HIGH SENSITIVITY): Troponin I (High Sensitivity): 744 ng/L (ref <18)

## 2020-10-14 LAB — MAGNESIUM: Magnesium: 2.2 mg/dL (ref 1.7–2.4)

## 2020-10-14 MED ORDER — CHLORHEXIDINE GLUCONATE CLOTH 2 % EX PADS
6.0000 | MEDICATED_PAD | Freq: Every day | CUTANEOUS | Status: DC
  Administered 2020-10-14 – 2020-10-15 (×3): 6 via TOPICAL

## 2020-10-14 MED ORDER — PERFLUTREN LIPID MICROSPHERE
1.0000 mL | INTRAVENOUS | Status: AC | PRN
  Administered 2020-10-14: 2 mL via INTRAVENOUS
  Filled 2020-10-14: qty 10

## 2020-10-14 MED ORDER — ORAL CARE MOUTH RINSE
15.0000 mL | Freq: Two times a day (BID) | OROMUCOSAL | Status: DC
  Administered 2020-10-14 – 2020-10-16 (×5): 15 mL via OROMUCOSAL

## 2020-10-14 NOTE — Plan of Care (Signed)
  Problem: Education: Goal: Knowledge of General Education information will improve Description Including pain rating scale, medication(s)/side effects and non-pharmacologic comfort measures Outcome: Progressing   Problem: Health Behavior/Discharge Planning: Goal: Ability to manage health-related needs will improve Outcome: Progressing   

## 2020-10-14 NOTE — Progress Notes (Signed)
NAME:  Ronnie Carey, MRN:  [id number], DOB:  08-11-1955, LOS: 1 ADMISSION DATE:  10/13/2020  History of Present Illness:  This is a 65 year old white male that presented to the emergency room from home.  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  Patient states he has been more sedentary and had an issue with sciatica approximately 4 weeks ago.  He noticed his left leg swelling more than the right and also being very sore.  He states he felt that pain get immediately better at the same time he developed acute onset shortness of breath.  He also experienced some substernal chest pain.  He recognized his symptoms but had things to do at his house before he came to the emergency room.  Symptoms were progressive through the day today.  He had a near syncopal episode while attempting to walk to the bathroom and then 911 was called.  Pulmonary embolism risk factors No hemoptysis No history of cancer No history of congestive heart failure Oxygen saturation is less than 90% on room air in the emergency room. MAP was greater than 65 in the emergency room Heart rate less than 110   Pertinent  Medical History  DVT Pulmonary embolism Obesity Osteoarthritis of the knees Small bowel obstruction Incarcerated umbilical hernia  Significant Hospital Events: Including procedures, antibiotic start and stop dates in addition to other pertinent events   5/9 Presented to ED, admitted with PE 5/10 admitted to ICU Hemodynamically stable, on heparin   Objective   Blood pressure 102/77, pulse 92, temperature 99.1 F (37.3 C), temperature source Oral, resp. rate (!) 21, height 5\' 11"  (1.803 m), weight 108.9 kg, SpO2 95 %.        Intake/Output Summary (Last 24 hours) at 10/14/2020 0758 Last data filed at 10/14/2020 0700 Gross per 24 hour  Intake 165.22 ml  Output --  Net 165.22 ml   Filed Weights   10/13/20 1842 10/14/20 0500  Weight:  108.9 kg 108.9 kg    General:  Well-appearing M, awake and conversational on [place] oxygen, no distress HEENT: MM pink/moist, sclera anicteric Neuro: awake, alert, oriented and following commands  CV: s1s2 tachycardic, no m/r/g PULM: clear bilaterally without tachypnea or accessory muscle use, on 6L [place] oxygen GI: soft, bsx4 active  Extremities: warm/dry, no edema  Skin: no rashes or lesions   Labs/imaging that I havepersonally reviewed  (right click and "Reselect all SmartList Selections" daily)   CBC BMP CT chest    Assessment & Plan:   Acute Saddle PE With RV strain, RV/LV ratio 3 and PESI score 114 P: -hemodynamically stable this AM and not requiring pressors, on 6L [place] without chest pain or dyspnea at rest -continue heparin gtt -Spoke with IR, we wll monitor this AM and get an official echo -LE dopplers  -wean O2 as able -closely monitor HR and BP       Best practice (right click and "Reselect all SmartList Selections" daily)  Diet:  NPO Pain/Anxiety/Delirium protocol (if indicated): No VAP protocol (if indicated): Not indicated DVT prophylaxis: Systemic AC GI prophylaxis: PPI Glucose control: Monitor blood sugar Central venous access:  N/A Arterial line:  N/A Foley:  N/A Mobility:  bed rest  PT consulted: N/A Last date of multidisciplinary goals of care discussion [ Code Status:  full code Disposition: Admit to the intensive care unit  Labs   CBC: Recent Labs  Lab 10/13/20 1845 10/14/20 0420  WBC 13.6* 11.9*  NEUTROABS 10.6*  --   HGB 15.8 14.5  HCT 46.7 41.8  MCV 89.5 88.4  PLT 240 232    Basic Metabolic Panel: Recent Labs  Lab 10/13/20 1845 10/14/20 0420  NA 136 137  K 4.0 4.0  CL 104 103  CO2 23 22  GLUCOSE 132* 134*  BUN 14 13  CREATININE 1.26* 1.20  CALCIUM 9.2 9.1  MG  --  2.2  PHOS  --  3.2   GFR: Estimated Creatinine Clearance: 78 mL/min (by C-G formula based on SCr of 1.2 mg/dL). Recent Labs  Lab 10/13/20 1845  10/14/20 0420  WBC 13.6* 11.9*    Liver Function Tests: Recent Labs  Lab 10/13/20 1845  AST 20  ALT 25  ALKPHOS 81  BILITOT 0.8  PROT 7.5  ALBUMIN 4.1   No results for input(s): LIPASE, AMYLASE in the last 168 hours. No results for input(s): AMMONIA in the last 168 hours.  ABG No results found for: PHART, PCO2ART, PO2ART, HCO3, TCO2, ACIDBASEDEF, O2SAT   Coagulation Profile: No results for input(s): INR, PROTIME in the last 168 hours.  Cardiac Enzymes: No results for input(s): CKTOTAL, CKMB, CKMBINDEX, TROPONINI in the last 168 hours.  HbA1C: No results found for: HGBA1C  CBG: Recent Labs  Lab 10/14/20 0110  GLUCAP 126*    Review of Systems:   See HPI   Past Medical History:  He,  has a past medical history of History of pulmonary embolus (PE) and SBO (small bowel obstruction) (HCC) (05/2018).   Surgical History:   Past Surgical History:  Procedure Laterality Date  . TONSILLECTOMY    . TOTAL KNEE ARTHROPLASTY Right 2012     Social History:   reports that he has never smoked. He has never used smokeless tobacco. He reports that he does not drink alcohol and does not use drugs.   Family History:  His family history is not on file.   Allergies No Known Allergies   Home Medications  Prior to Admission medications   Medication Sig Start Date End Date Taking? Authorizing Provider  omeprazole (PRILOSEC) 20 MG capsule Take 1 capsule (20 mg total) by mouth daily. To reduce stomach acid 05/18/19  Yes Fulp, Cammie, MD     Critical care time:    CRITICAL CARE Performed by: Darcella Gasman Alajah Witman   Total critical care time: 35 minutes  Critical care time was exclusive of separately billable procedures and treating other patients.  Critical care was necessary to treat or prevent imminent or life-threatening deterioration.  Critical care was time spent personally by me on the following activities: development of treatment plan with patient and/or  surrogate as well as nursing, discussions with consultants, evaluation of patient's response to treatment, examination of patient, obtaining history from patient or surrogate, ordering and performing treatments and interventions, ordering and review of laboratory studies, ordering and review of radiographic studies, pulse oximetry and re-evaluation of patient's condition.  Darcella Gasman Ariannie Penaloza, PA-C [facility] See Amion for pager If no response to pager , please call [phone] until 7pm After 7:00 pm call Elink  [phone]

## 2020-10-14 NOTE — Progress Notes (Addendum)
ABRIDGED PHARMACY NOTE  Patient provided active Medicaid Card with Recipient ID # [id number] T           Calton Dach, PharmD PGY1 Pharmacy Resident 10/14/2020 2:17 PM

## 2020-10-14 NOTE — TOC Benefit Eligibility Note (Signed)
Patient Product/process development scientist completed.    The patient is currently admitted and upon discharge could be taking Eliquis 5 mg.  The current 30 day co-pay is, $3.00.   The patient is insured through [facility]    Roland Earl, CPhT Pharmacy Patient Advocate Specialist [facility] Antimicrobial Stewardship Team Direct Number: [phone]  Fax: [phone]

## 2020-10-14 NOTE — Plan of Care (Signed)
  Problem: Clinical Measurements: Goal: Ability to maintain clinical measurements within normal limits will improve Outcome: Progressing Goal: Will remain free from infection Outcome: Progressing Goal: Respiratory complications will improve Outcome: Progressing Goal: Cardiovascular complication will be avoided Outcome: Progressing   

## 2020-10-14 NOTE — Progress Notes (Signed)
Bilateral lower extremity venous duplex completed. Refer to "CV Proc" under chart review to view preliminary results.  Preliminary results discussed with Dr. Denese Killings.  10/14/2020 2:28 PM Eula Fried., MHA, RVT, RDCS, RDMS

## 2020-10-14 NOTE — Progress Notes (Addendum)
ANTICOAGULATION CONSULT NOTE - Initial Consult  Pharmacy Consult for heparin Indication: R/o acute PE  No Known Allergies  Patient Measurements: Height: 5\' 11"  (180.3 cm) Weight: 108.9 kg (240 lb 1.3 oz) IBW/kg (Calculated) : 75.3  Vital Signs: Temp: 99.1 F (37.3 C) (05/10 0400) Temp Source: Oral (05/10 0400) BP: 108/78 (05/10 0600) Pulse Rate: 96 (05/10 0600)  Labs: Recent Labs    10/13/20 1845 10/13/20 2100 10/13/20 2340 10/14/20 0420  HGB 15.8  --   --  14.5  HCT 46.7  --   --  41.8  PLT 240  --   --  232  HEPARINUNFRC  --   --   --  0.53  CREATININE 1.26*  --   --  1.20  TROPONINIHS 454* 584* 744*  --     Estimated Creatinine Clearance: 78 mL/min (by C-G formula based on SCr of 1.2 mg/dL).   Medical History: Past Medical History:  Diagnosis Date  . History of pulmonary embolus (PE)   . SBO (small bowel obstruction) (HCC) 05/2018    Medications:  Medications Prior to Admission  Medication Sig Dispense Refill Last Dose  . omeprazole (PRILOSEC) 20 MG capsule Take 1 capsule (20 mg total) by mouth daily. To reduce stomach acid 90 capsule 0 Past Week at Unknown time    Assessment: 70 YOM who presents with SOB and chest pain found with h/o or PE and DVT found to have elevated cardiac markers. Pharmacy consulted to start IV heparin. Patient found to have significant PE bilateral with saddle component and RV/LV ratio of 3 on CT.   Heparin level 0.53 this AM (therapeutic) Hgb 14.5, plt 232  No issues with infusion or s/sx bleeding per RN overnight   Goal of Therapy:  Heparin level 0.3-0.7 units/ml Monitor platelets by anticoagulation protocol: Yes   Plan:  -Continue heparin infusion at 1800 units/hr  -F/u confirmatory 6 hr HL -Monitor daily HL, CBC and s/sx of bleeding -f/u long term anticoag management  --------------------------------------  Confirmatory heparin level 0.49 (therapeutic)  -Continue heparin infusion at current rate 1800  units/hr -F/u daily HL, CBC -F/u s/sx bleeding  78, PharmD PGY1 Pharmacy Resident 10/14/2020 1:14 PM

## 2020-10-14 NOTE — Progress Notes (Signed)
  Echocardiogram 2D Echocardiogram with defintiy has been performed.  Leta Jungling M 10/14/2020, 1:22 PM

## 2020-10-14 NOTE — Progress Notes (Signed)
eLink Physician-Brief Progress Note Patient Name: Ronnie Carey DOB: [birth year] MRN: [id number]   Date of Service  10/14/2020  HPI/Events of Note  65/F with hx of DVT and PE 10 years ago, presenting with left leg swelling and subsequently with shortness of breath and substernal chest pain.  He had a syncopal episode prompting the call to EMS. CTA showed new saddle PE.   BP 123/88, HR 120, RR 25, O2 sats 95%.  CTA Significant bilateral pulmonary emboli with evidence of saddle component centrally. Elevated RV/LV ratio is noted of 3.  Patchy airspace opacity in the medial aspect of the left upper lobe which may represent edema or early infiltrate.   eICU Interventions  Start on heparin gtt.  Continue O2 support. Protonix.      Intervention Category Evaluation Type: New Patient Evaluation  Larinda Buttery 10/14/2020, 1:17 AM

## 2020-10-15 DIAGNOSIS — I2699 Other pulmonary embolism without acute cor pulmonale: Secondary | ICD-10-CM

## 2020-10-15 LAB — BASIC METABOLIC PANEL
Anion gap: 8 (ref 5–15)
BUN: 14 mg/dL (ref 8–23)
CO2: 25 mmol/L (ref 22–32)
Calcium: 8.9 mg/dL (ref 8.9–10.3)
Chloride: 104 mmol/L (ref 98–111)
Creatinine, Ser: 1.07 mg/dL (ref 0.61–1.24)
GFR, Estimated: >60 mL/min (ref >60)
Glucose, Bld: 81 mg/dL (ref 70–99)
Potassium: 4.2 mmol/L (ref 3.5–5.1)
Sodium: 137 mmol/L (ref 135–145)

## 2020-10-15 LAB — CBC
HCT: 42.6 % (ref 39.0–52.0)
Hemoglobin: 14.1 g/dL (ref 13.0–17.0)
MCH: 30 pg (ref 26.0–34.0)
MCHC: 33.1 g/dL (ref 30.0–36.0)
MCV: 90.6 fL (ref 80.0–100.0)
Platelets: 214 10*3/uL (ref 150–400)
RBC: 4.7 MIL/uL (ref 4.22–5.81)
RDW: 12.6 % (ref 11.5–15.5)
WBC: 10 10*3/uL (ref 4.0–10.5)
nRBC: 0 % (ref 0.0–0.2)

## 2020-10-15 LAB — LIPID PANEL
Cholesterol: 209 mg/dL — ABNORMAL HIGH (ref 0–200)
HDL: 45 mg/dL (ref >40)
LDL Cholesterol: 143 mg/dL — ABNORMAL HIGH (ref 0–99)
Total CHOL/HDL Ratio: 4.6 RATIO
Triglycerides: 105 mg/dL (ref <150)
VLDL: 21 mg/dL (ref 0–40)

## 2020-10-15 LAB — HEPARIN LEVEL (UNFRACTIONATED): Heparin Unfractionated: 0.36 IU/mL (ref 0.30–0.70)

## 2020-10-15 MED ORDER — PANTOPRAZOLE SODIUM 40 MG PO TBEC
40.0000 mg | DELAYED_RELEASE_TABLET | Freq: Every day | ORAL | Status: DC
  Administered 2020-10-15: 40 mg via ORAL
  Filled 2020-10-15: qty 1

## 2020-10-15 MED ORDER — APIXABAN 5 MG PO TABS
10.0000 mg | ORAL_TABLET | Freq: Two times a day (BID) | ORAL | Status: DC
  Administered 2020-10-15 – 2020-10-16 (×3): 10 mg via ORAL
  Filled 2020-10-15 (×3): qty 2

## 2020-10-15 MED ORDER — APIXABAN 5 MG PO TABS: 5.0000 mg | ORAL_TABLET | Freq: Two times a day (BID) | ORAL | Status: DC

## 2020-10-15 NOTE — Discharge Instructions (Signed)
Information on my medicine - ELIQUIS (apixaban)  Why was Eliquis prescribed for you? Eliquis was prescribed to treat blood clots that may have been found in the veins of your legs (deep vein thrombosis) or in your lungs (pulmonary embolism) and to reduce the risk of them occurring again.  What do You need to know about Eliquis ? The starting dose is 10 mg (two 5 mg tablets) taken TWICE daily for the FIRST SEVEN (7) DAYS, then on Wednesday May 18th (10/22/2020)  the dose is reduced to ONE 5 mg tablet taken TWICE daily.  Eliquis may be taken with or without food.   Try to take the dose about the same time in the morning and in the evening. If you have difficulty swallowing the tablet whole please discuss with your pharmacist how to take the medication safely.  Take Eliquis exactly as prescribed and DO NOT stop taking Eliquis without talking to the doctor who prescribed the medication.  Stopping may increase your risk of developing a new blood clot.  Refill your prescription before you run out.  After discharge, you should have regular check-up appointments with your healthcare provider that is prescribing your Eliquis.    What do you do if you miss a dose? If a dose of ELIQUIS is not taken at the scheduled time, take it as soon as possible on the same day and twice-daily administration should be resumed. The dose should not be doubled to make up for a missed dose.  Important Safety Information A possible side effect of Eliquis is bleeding. You should call your healthcare provider right away if you experience any of the following: ? Bleeding from an injury or your nose that does not stop. ? Unusual colored urine (red or dark brown) or unusual colored stools (red or black). ? Unusual bruising for unknown reasons. ? A serious fall or if you hit your head (even if there is no bleeding).  Some medicines may interact with Eliquis and might increase your risk of bleeding or clotting while on  Eliquis. To help avoid this, consult your healthcare provider or pharmacist prior to using any new prescription or non-prescription medications, including herbals, vitamins, non-steroidal anti-inflammatory drugs (NSAIDs) and supplements.  This website has more information on Eliquis (apixaban): http://[email or web address].com/eliquis/home

## 2020-10-15 NOTE — Progress Notes (Addendum)
ANTICOAGULATION CONSULT NOTE - Initial Consult  Pharmacy Consult for heparin Indication: PE and DVT  No Known Allergies  Patient Measurements: Height: 5\' 11"  (180.3 cm) Weight: 107.9 kg (237 lb 14 oz) (Bed weight d/t pt. bed restriction per MD.) IBW/kg (Calculated) : 75.3  Vital Signs: Temp: 98.4 F (36.9 C) (05/11 0400) Temp Source: Oral (05/11 0400) BP: 123/72 (05/11 0600) Pulse Rate: 74 (05/11 0600)  Labs: Recent Labs    10/13/20 1845 10/13/20 2100 10/13/20 2340 10/14/20 0420 10/14/20 1105 10/15/20 0041  HGB 15.8  --   --  14.5  --  14.1  HCT 46.7  --   --  41.8  --  42.6  PLT 240  --   --  232  --  214  HEPARINUNFRC  --   --   --  0.53 0.49 0.36  CREATININE 1.26*  --   --  1.20  --  1.07  TROPONINIHS 454* 584* 744*  --   --   --     Estimated Creatinine Clearance: 87.1 mL/min (by C-G formula based on SCr of 1.07 mg/dL).   Medical History: Past Medical History:  Diagnosis Date  . History of pulmonary embolus (PE)   . SBO (small bowel obstruction) (HCC) 05/2018    Medications:  Medications Prior to Admission  Medication Sig Dispense Refill Last Dose  . omeprazole (PRILOSEC) 20 MG capsule Take 1 capsule (20 mg total) by mouth daily. To reduce stomach acid 90 capsule 0 Past Week at Unknown time    Assessment: 31 YOM who presents with SOB and chest pain found with h/o or PE and DVT found to have elevated cardiac markers. Pharmacy consulted to start IV heparin. Patient found to have significant PE bilateral with saddle component and RV/LV ratio of 3 on CT. Pt now also found to have significant DVTs in right and left lower extremities.  Heparin level 0.36 this AM (therapeutic but downtrending) Hgb 14.1, plt 214  No issues with infusion or s/sx bleeding per RN overnight   Goal of Therapy:  Heparin level 0.3-0.7 units/ml Monitor platelets by anticoagulation protocol: Yes   Plan:  - Increase heparin infusion to 1900 units / hr -F/u 6 hr HL -Monitor daily  HL, CBC and s/sx of bleeding -f/u long term anticoag management  -------------------------------- Addendum:  Pharmacy consulted to transition to apixaban for treatment of PE and DVT  Turn off heparin drip and give first dose of eliquis at same time apixaban 10mg  BID x7 days then apixaban 5mg  BID thereafter Co-pay is $3 via [place] [employer]  F/u s/sx bleeding and CBC PRN

## 2020-10-15 NOTE — Progress Notes (Addendum)
NAME:  Ronnie Carey, MRN:  [id number], DOB:  03-03-1956, LOS: 2 ADMISSION DATE:  10/13/2020  History of Present Illness:  This is a 65 year old white male that presented to the emergency room from home.  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  Patient states he has been more sedentary and had an issue with sciatica approximately 4 weeks ago.  He noticed his left leg swelling more than the right and also being very sore.  He states he felt that pain get immediately better at the same time he developed acute onset shortness of breath.  He also experienced some substernal chest pain.  He recognized his symptoms but had things to do at his house before he came to the emergency room.  Symptoms were progressive through the day today.  He had a near syncopal episode while attempting to walk to the bathroom and then 911 was called.  Pulmonary embolism risk factors No hemoptysis No history of cancer No history of congestive heart failure Oxygen saturation is less than 90% on room air in the emergency room. MAP was greater than 65 in the emergency room Heart rate less than 110   Pertinent  Medical History  DVT Pulmonary embolism Obesity Osteoarthritis of the knees Small bowel obstruction Incarcerated umbilical hernia  Significant Hospital Events: Including procedures, antibiotic start and stop dates in addition to other pertinent events   5/9 Presented to ED, admitted with PE 5/10 admitted to ICU Hemodynamically stable, on heparin   Objective   Blood pressure 110/79, pulse 83, temperature 98.6 F (37 C), temperature source Oral, resp. rate 17, height 5\' 11"  (1.803 m), weight 107.9 kg, SpO2 (!) 89 %.        Intake/Output Summary (Last 24 hours) at 10/15/2020 0809 Last data filed at 10/15/2020 0700 Gross per 24 hour  Intake 873.86 ml  Output 1425 ml  Net -551.14 ml   Filed Weights   10/13/20 1842 10/14/20 0500 10/15/20  0500  Weight: 108.9 kg 108.9 kg 107.9 kg    General: Well-nourished well-developed male no acute distress HEENT: The lymphadenopathy is appreciated Neuro: Gross intact without focal defect somewhat strange affect CV: Sounds are regular PULM: Diminished in the bases currently on room air with sats 95% GI: soft, bsx4 active  GU: Voids Extremities: warm/dry, 1+ edema  Skin: no rashes or lesions    Labs/imaging that I havepersonally reviewed  (right click and "Reselect all SmartList Selections" daily)   2d Cbc bmet Lower extremity Doppler studies   Assessment & Plan:   Acute Saddle PE With RV strain, RV/LV ratio 3 and PESI score 114 P: Hemodynamically stable currently on room air with O2 sats 95% denies shortness of breath Remains on heparin drip will transition to Eliquis 10/14/2020 lower extremity venous Doppler studies show right acute deep vein thrombosis involving the right femoral vein, right popliteal vein, right posterior tibial veins, right peroneal veins and right gastrocnemius veins.  The DVT in the popliteal vein is mobile Left consistent with age-indeterminate deep vein thrombosis involving the left common femoral vein, SF junction, left femoral vein and left proximal profunda vein, left popliteal vein, left posterior tibial veins and left gastrocnemius veins.  Currently on room air Check bedside echo to determine right heart strain. If remains stable transfer to stepdown unit and to [facility] service Note he has appointment with Dr. 12/14/2020 on November 05, 2020 at 10 AM.    Elevated LDL Start  Lipitor     Best practice (right click and "Reselect all SmartList Selections" daily)  Diet:  Oral Pain/Anxiety/Delirium protocol (if indicated): No VAP protocol (if indicated): Not indicated DVT prophylaxis: Systemic AC GI prophylaxis: PPI Glucose control: Monitor blood sugar Central venous access:  N/A Arterial line:  N/A Foley:  N/A Mobility:  bed rest  PT  consulted: N/A Last date of multidisciplinary goals of care discussion [ Code Status:  full code Disposition: Admit to the intensive care unit, 5/11 tx to sdu  Labs   CBC: Recent Labs  Lab 10/13/20 1845 10/14/20 0420 10/15/20 0041  WBC 13.6* 11.9* 10.0  NEUTROABS 10.6*  --   --   HGB 15.8 14.5 14.1  HCT 46.7 41.8 42.6  MCV 89.5 88.4 90.6  PLT 240 232 214    Basic Metabolic Panel: Recent Labs  Lab 10/13/20 1845 10/14/20 0420 10/15/20 0041  NA 136 137 137  K 4.0 4.0 4.2  CL 104 103 104  CO2 23 22 25   GLUCOSE 132* 134* 81  BUN 14 13 14   CREATININE 1.26* 1.20 1.07  CALCIUM 9.2 9.1 8.9  MG  --  2.2  --   PHOS  --  3.2  --    GFR: Estimated Creatinine Clearance: 87.1 mL/min (by C-G formula based on SCr of 1.07 mg/dL). Recent Labs  Lab 10/13/20 1845 10/14/20 0420 10/15/20 0041  WBC 13.6* 11.9* 10.0    Liver Function Tests: Recent Labs  Lab 10/13/20 1845  AST 20  ALT 25  ALKPHOS 81  BILITOT 0.8  PROT 7.5  ALBUMIN 4.1   No results for input(s): LIPASE, AMYLASE in the last 168 hours. No results for input(s): AMMONIA in the last 168 hours.  ABG No results found for: PHART, PCO2ART, PO2ART, HCO3, TCO2, ACIDBASEDEF, O2SAT   Coagulation Profile: No results for input(s): INR, PROTIME in the last 168 hours.  Cardiac Enzymes: No results for input(s): CKTOTAL, CKMB, CKMBINDEX, TROPONINI in the last 168 hours.  HbA1C: No results found for: HGBA1C  CBG: Recent Labs  Lab 10/14/20 0110  GLUCAP 126*     Critical care time: 12/13/20    CRITICAL CARE. 12/14/20 Adreanna Fickel ACNP Acute Care Nurse Practitioner Pulmonary/Critical Care Please consult Amion 10/15/2020, 8:10 AM

## 2020-10-16 LAB — CBC
HCT: 40.8 % (ref 39.0–52.0)
Hemoglobin: 14.1 g/dL (ref 13.0–17.0)
MCH: 30.3 pg (ref 26.0–34.0)
MCHC: 34.6 g/dL (ref 30.0–36.0)
MCV: 87.6 fL (ref 80.0–100.0)
Platelets: 208 10*3/uL (ref 150–400)
RBC: 4.66 MIL/uL (ref 4.22–5.81)
RDW: 12.4 % (ref 11.5–15.5)
WBC: 9 10*3/uL (ref 4.0–10.5)
nRBC: 0 % (ref 0.0–0.2)

## 2020-10-16 MED ORDER — POLYETHYLENE GLYCOL 3350 17 G PO PACK: 17.0000 g | PACK | Freq: Every day | ORAL | 0 refills | Status: DC | PRN

## 2020-10-16 MED ORDER — APIXABAN (ELIQUIS) VTE STARTER PACK (10MG AND 5MG): ORAL_TABLET | ORAL | 0 refills | Status: DC

## 2020-10-16 NOTE — Progress Notes (Signed)
Discharge orders completed. Removed PIVx3 and removed from monitor. VSS, satting 91-96% on RA at rest. Ambulated with patient around hall, tolerated with short break. Stable on feet. Discharge instructions, including medication administration, completed with teachback from patient. Patient will fill Eliquis rx at pharmacy independently. Able to dress self and complete ADLs without c/o SOB. Discharged via wheelchair at 1220 with volunteer.   Lindsay Soulliere RN

## 2020-10-16 NOTE — Discharge Summary (Signed)
Physician Discharge Summary  Coulter Oldaker ZHY:[id number] DOB: [birth year] DOA: 10/13/2020  PCP: Cain Saupe, MD (Inactive)  Admit date: 10/13/2020 Discharge date: 10/16/2020  Admitted From: Home Disposition: Home  Recommendations for Outpatient Follow-up:  1. Follow up with PCP in 1-2 weeks 2. Follow-up with pulmonology 3. Please obtain BMP/CBC in one week 4. Please follow up on the following pending results: None  Home Health: No Equipment/Devices: None Discharge Condition: Stable CODE STATUS: Full Diet recommendation: Heart Healthy   Brief/Interim Summary:  65 year old white male that presented to the emergency room from home.  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  Patient states he has been more sedentary and had an issue with sciatica approximately 4 weeks ago.  He noticed his left leg swelling more than the right and also being very sore.  He states he felt that pain get immediately better at the same time he developed acute onset shortness of breath.  He also experienced some substernal chest pain.  He recognized his symptoms but had things to do at his house before he came to the emergency room.  Symptoms were progressive through the day today.  He had a near syncopal episode while attempting to walk to the bathroom and then 911 was called.  He was found to have DVT in left popliteal vein along with saddle PE bilaterally, echocardiogram with moderately reduced right ventricular function secondary to PE.  Patient remained hemodynamically stable, initially treated with heparin infusion and later transition to Eliquis.  He was saturating well on room air before discharge.  PCCM offered another day of monitoring but he refused stating that he wants to go home.  Patient was discharged on Eliquis and will follow up with his primary care provider for further recommendations.  Discharge Diagnoses:  Active Problems:   Acute  saddle pulmonary embolism [facility])   Discharge Instructions  Discharge Instructions    Diet - low sodium heart healthy   Complete by: As directed    Discharge instructions   Complete by: As directed    It was pleasure taking care of you. You are being started on Eliquis which is a blood thinner, please mindful of any abnormal bleeding. Please follow-up with your primary care provider for further recommendations and duration of blood thinner, you will be needing this medicine for at least 71-month or more.   Increase activity slowly   Complete by: As directed      Allergies as of 10/16/2020   No Known Allergies     Medication List    TAKE these medications   Apixaban Starter Pack (  and ) Commonly known as: ELIQUIS STARTER PACK Take as directed on package: start with two-5mg  tablets twice daily for 7 days. On day 8, switch to one-5mg  tablet twice daily.   omeprazole 20 MG capsule Commonly known as: PRILOSEC Take 1 capsule (20 mg total) by mouth daily. To reduce stomach acid   polyethylene glycol 17 g packet Commonly known as: MIRALAX / GLYCOLAX Take 17 g by mouth daily as needed for moderate constipation.       Follow-up Information    Hunsucker, Lesia Sago, MD Follow up on 11/05/2020.   Specialty: Pulmonary Disease Why: Dr. Judeth Horn 11/05/20 at 10:00am Contact information: [address] [place] [address] [phone]        Cain Saupe, MD. Schedule an appointment as soon as possible for a visit.   Specialty: Family  Medicine Contact information: [address] [place] [address] [phone]              No Known Allergies  Consultations:  PCCM  Procedures/Studies: CT Angio Chest PE W and/or Wo Contrast  Result Date: 10/13/2020 CLINICAL DATA:  Shortness of breath EXAM: CT ANGIOGRAPHY CHEST WITH CONTRAST TECHNIQUE: Multidetector CT imaging of the chest was performed using the standard protocol during bolus  administration of intravenous contrast. Multiplanar CT image reconstructions and MIPs were obtained to evaluate the vascular anatomy. CONTRAST:  60mL OMNIPAQUE IOHEXOL 350 MG/ML SOLN COMPARISON:  Chest x-ray from earlier in the same day. FINDINGS: Cardiovascular: Thoracic aorta shows mild atherosclerotic change without aneurysmal dilatation or dissection. Pulmonary artery is prominent and demonstrates significant bilateral pulmonary emboli with saddle component bridging the bifurcation of the pulmonary artery. The RV/LV ratio is calculated at 3. Increased central venous pressure is noted with collateralization in the mediastinum. Mild coronary calcifications are noted. Mediastinum/Nodes: Thoracic inlet is within normal limits. No sizable hilar or mediastinal adenopathy is noted. The esophagus as visualized is within normal limits. Lungs/Pleura: Lungs are well aerated bilaterally. Patchy airspace opacity is noted in the medial aspect of the left upper lobe adjacent to the mediastinum which may represent some focal edema or early pneumonic infiltrate. Upper Abdomen: Visualized upper abdomen shows upper pole cyst in the left kidney. No acute abnormality is noted. Musculoskeletal: Degenerative changes of the thoracic spine are noted. Review of the MIP images confirms the above findings. IMPRESSION: Significant bilateral pulmonary emboli with evidence of saddle component centrally. Elevated RV/LV ratio is noted of 3. Patchy airspace opacity in the medial aspect of the left upper lobe which may represent edema or early infiltrate. Aortic Atherosclerosis (ICD10-I70.0). Critical Value/emergent results were called by telephone at the time of interpretation on 10/13/2020 at 9:33 pm to Dr. Marylouise Stacks, who verbally acknowledged these results. Electronically Signed   By: Alcide Clever M.D.   On: 10/13/2020 21:37   DG Chest Port 1 View  Result Date: 10/13/2020 CLINICAL DATA:  Chest pain and shortness of breath EXAM: PORTABLE  CHEST 1 VIEW COMPARISON:  None. FINDINGS: The heart size and mediastinal contours are within normal limits. Both lungs are clear. The visualized skeletal structures are unremarkable. IMPRESSION: No active disease. Electronically Signed   By: Alcide Clever M.D.   On: 10/13/2020 19:20   ECHOCARDIOGRAM COMPLETE  Result Date: 10/14/2020    ECHOCARDIOGRAM REPORT   Patient Name:   ZEBULON GANTT Date of Exam: 10/14/2020 Medical Rec #:  [id number]      Height:       71.0 in Accession #:    [id number]     Weight:       240.1 lb Date of Birth:  08-08-1955      BSA:          2.279 m Patient Age:    65 years       BP:           110/72 mmHg Patient Gender: M              HR:           88 bpm. Exam Location:  Inpatient Procedure: 2D Echo, Cardiac Doppler, Color Doppler and Intracardiac            Opacification Agent Indications:    Pulmonary Embolus I26.09  History:        Patient has prior history of Echocardiogram examinations. DVT.  Pulmonary embolism.  Sonographer:    Leta Jungling RDCS Referring Phys: [id number] CHRISTOPHER R DOROTHY IMPRESSIONS  1. Left ventricular ejection fraction, by estimation, is 65 to 70%. The left ventricle has normal function. The left ventricle has no regional wall motion abnormalities. There is mild left ventricular hypertrophy of the basal-septal segment. Left ventricular diastolic parameters are consistent with Grade I diastolic dysfunction (impaired relaxation). There is the interventricular septum is flattened in systole, consistent with right ventricular pressure overload.  2. McConnell's sign is present: hemodynamically important pulmonary embolism. Right ventricular systolic function is moderately reduced. The right ventricular size is moderately enlarged. There is mildly elevated pulmonary artery systolic pressure.  3. The mitral valve is normal in structure. No evidence of mitral valve regurgitation. No evidence of mitral stenosis.  4. The aortic valve is normal in  structure. Aortic valve regurgitation is not visualized. No aortic stenosis is present.  5. The inferior vena cava is dilated in size with <50% respiratory variability, suggesting right atrial pressure of 15 mmHg. FINDINGS  Left Ventricle: Left ventricular ejection fraction, by estimation, is 65 to 70%. The left ventricle has normal function. The left ventricle has no regional wall motion abnormalities. Definity contrast agent was given IV to delineate the left ventricular  endocardial borders. The left ventricular internal cavity size was normal in size. There is mild left ventricular hypertrophy of the basal-septal segment. The interventricular septum is flattened in systole, consistent with right ventricular pressure overload. Left ventricular diastolic parameters are consistent with Grade I diastolic dysfunction (impaired relaxation). Normal left ventricular filling pressure. Right Ventricle: McConnell's sign is present: hemodynamically important pulmonary embolism. The right ventricular size is moderately enlarged. No increase in right ventricular wall thickness. Right ventricular systolic function is moderately reduced. There is mildly elevated pulmonary artery systolic pressure. The tricuspid regurgitant velocity is 3.20 m/s, and with an assumed right atrial pressure of 3 mmHg, the estimated right ventricular systolic pressure is 44.0 mmHg. Left Atrium: Left atrial size was normal in size. Right Atrium: Right atrial size was normal in size. Pericardium: There is no evidence of pericardial effusion. Mitral Valve: The mitral valve is normal in structure. No evidence of mitral valve regurgitation. No evidence of mitral valve stenosis. Tricuspid Valve: The tricuspid valve is normal in structure. Tricuspid valve regurgitation is not demonstrated. No evidence of tricuspid stenosis. Aortic Valve: The aortic valve is normal in structure. Aortic valve regurgitation is not visualized. No aortic stenosis is present.  Pulmonic Valve: The pulmonic valve was normal in structure. Pulmonic valve regurgitation is not visualized. No evidence of pulmonic stenosis. Aorta: The aortic root is normal in size and structure. Venous: The inferior vena cava is dilated in size with less than 50% respiratory variability, suggesting right atrial pressure of 15 mmHg. IAS/Shunts: No atrial level shunt detected by color flow Doppler.  LEFT VENTRICLE PLAX 2D LVIDd:         4.30 cm  Diastology LVIDs:         3.50 cm  LV e' medial:    5.66 cm/s LV PW:         0.90 cm  LV E/e' medial:  9.7 LV IVS:        1.30 cm  LV e' lateral:   9.36 cm/s LVOT diam:     2.00 cm  LV E/e' lateral: 5.9 LV SV:         41 LV SV Index:   18 LVOT Area:     3.14 cm  RIGHT  VENTRICLE RV S prime:     11.10 cm/s TAPSE (M-mode): 1.4 cm LEFT ATRIUM             Index LA diam:        2.90 cm 1.27 cm/m LA Vol (A2C):   31.9 ml 14.00 ml/m LA Vol (A4C):   41.4 ml 18.17 ml/m LA Biplane Vol: 36.8 ml 16.15 ml/m  AORTIC VALVE LVOT Vmax:   96.10 cm/s LVOT Vmean:  66.200 cm/s LVOT VTI:    0.131 m  AORTA Ao Root diam: 3.60 cm MITRAL VALVE               TRICUSPID VALVE MV Area (PHT): 3.53 cm    TR Peak grad:   41.0 mmHg MV Decel Time: 215 msec    TR Vmax:        320.00 cm/s MV E velocity: 54.80 cm/s MV A velocity: 68.10 cm/s  SHUNTS MV E/A ratio:  0.80        Systemic VTI:  0.13 m                            Systemic Diam: 2.00 cm Rachelle Hora Croitoru MD Electronically signed by Thurmon Fair MD Signature Date/Time: 10/14/2020/2:02:40 PM    Final    VAS [place] LOWER EXTREMITY VENOUS (DVT)  Result Date: 10/14/2020  Lower Venous DVT Study Patient Name:  STEPHANE NIEMANN  Date of Exam:   10/14/2020 Medical Rec #: [id number]       Accession #:    [id number] Date of Birth: 1956-03-17       Patient Gender: M Patient Age:   71Y Exam Location:  [facility] Procedure:      VAS [place] LOWER EXTREMITY VENOUS (DVT) Referring Phys: [id number] CHRISTOPHER R DOROTHY  --------------------------------------------------------------------------------  Indications: Pulmonary embolism, and left lower extremity edema x months per patient.  Comparison Study: 10/01/2010 left lower extremity venous duplex- extensive DVT                   throughout left lower extremity. Performing Technologist: Gertie Fey MHA, RDMS, RVT, RDCS  Examination Guidelines: A complete evaluation includes B-mode imaging, spectral Doppler, color Doppler, and power Doppler as needed of all accessible portions of each vessel. Bilateral testing is considered an integral part of a complete examination. Limited examinations for reoccurring indications may be performed as noted. The reflux portion of the exam is performed with the patient in reverse Trendelenburg.  +---------+---------------+---------+-----------+----------+--------------+ RIGHT    CompressibilityPhasicitySpontaneityPropertiesThrombus Aging +---------+---------------+---------+-----------+----------+--------------+ CFV      Full           No       Yes                                 +---------+---------------+---------+-----------+----------+--------------+ SFJ      Full                                                        +---------+---------------+---------+-----------+----------+--------------+ FV Prox  Full                                                        +---------+---------------+---------+-----------+----------+--------------+  FV Mid   Full                                                        +---------+---------------+---------+-----------+----------+--------------+ FV DistalNone                    No                   Acute          +---------+---------------+---------+-----------+----------+--------------+ PFV      Full                                                        +---------+---------------+---------+-----------+----------+--------------+ POP                               No                   Acute, mobile  +---------+---------------+---------+-----------+----------+--------------+ PTV                              No                   Acute          +---------+---------------+---------+-----------+----------+--------------+ PERO                             No                   Acute          +---------+---------------+---------+-----------+----------+--------------+ Gastroc                          No                   Acute          +---------+---------------+---------+-----------+----------+--------------+   +---------+---------------+---------+-----------+------------+-----------------+ LEFT     CompressibilityPhasicitySpontaneityProperties  Thrombus Aging    +---------+---------------+---------+-----------+------------+-----------------+ CFV      Partial        No       Yes                    Age Indeterminate +---------+---------------+---------+-----------+------------+-----------------+ SFJ      Partial                                        Age Indeterminate +---------+---------------+---------+-----------+------------+-----------------+ FV Prox  None                               retracted   Age Indeterminate +---------+---------------+---------+-----------+------------+-----------------+ FV Mid   None  Age Indeterminate +---------+---------------+---------+-----------+------------+-----------------+ FV DistalNone                                           Age Indeterminate +---------+---------------+---------+-----------+------------+-----------------+ PFV      None                                           Age Indeterminate +---------+---------------+---------+-----------+------------+-----------------+ POP      None                               softly      Age Indeterminate                                             echogenic                      +---------+---------------+---------+-----------+------------+-----------------+ PTV      None                               softly      Age Indeterminate                                             echogenic                     +---------+---------------+---------+-----------+------------+-----------------+ PERO                                        Not                                                                       visualized                    +---------+---------------+---------+-----------+------------+-----------------+ Gastroc  None                    No                     Age Indeterminate +---------+---------------+---------+-----------+------------+-----------------+     Summary: RIGHT: - Findings consistent with acute deep vein thrombosis involving the right femoral vein, right popliteal vein, right posterior tibial veins, right peroneal veins, and right gastrocnemius veins. DVT in the popliteal vein is mobile. - No cystic structure found in the popliteal fossa.  LEFT: - Findings consistent with age indeterminate deep vein thrombosis involving the left common femoral vein, SF junction, left femoral vein, left proximal profunda vein, left popliteal vein, left posterior tibial veins, and left gastrocnemius veins.  Pulsatile lower extremity venous flow is suggestive of elevated right  heart pressure. *See table(s) above for measurements and observations. Electronically signed by Gretta Began MD on 10/14/2020 at 3:47:21 PM.    Final      Subjective: Patient was seen and examined today.  No new complaint.  On room air.  Denies any chest pain or shortness of breath.  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  Discharge Exam: Vitals:   10/16/20 0400 10/16/20 0811  BP: 110/73   Pulse: 81   Resp: 19   Temp: 98.7 F (37.1 C) 98.1 F (36.7 C)  SpO2: 93%    Vitals:    10/16/20 0300 10/16/20 0400 10/16/20 0424 10/16/20 0811  BP: 119/76 110/73    Pulse: 81 81    Resp: 20 19    Temp:  98.7 F (37.1 C)  98.1 F (36.7 C)  TempSrc:  Oral  Oral  SpO2: 90% 93%    Weight:   96.7 kg   Height:        General: Pt is alert, awake, not in acute distress Cardiovascular: RRR, S1/S2 +, no rubs, no gallops Respiratory: CTA bilaterally, no wheezing, no rhonchi Abdominal: Soft, NT, ND, bowel sounds + Extremities: no edema, no cyanosis   The results of significant diagnostics from this hospitalization (including imaging, microbiology, ancillary and laboratory) are listed below for reference.    Microbiology: Recent Results (from the past 240 hour(s))  Resp Panel by RT-PCR (Flu A&B, Covid) Nasopharyngeal Swab     Status: None   Collection Time: 10/13/20  7:57 PM   Specimen: Nasopharyngeal Swab; Nasopharyngeal(NP) swabs in vial transport medium  Result Value Ref Range Status   SARS Coronavirus 2 by RT PCR NEGATIVE NEGATIVE Final    Comment: (NOTE) SARS-CoV-2 target nucleic acids are NOT DETECTED.  The SARS-CoV-2 RNA is generally detectable in upper respiratory specimens during the acute phase of infection. The lowest concentration of SARS-CoV-2 viral copies this assay can detect is 138 copies/mL. A negative result does not preclude SARS-Cov-2 infection and should not be used as the sole basis for treatment or other patient management decisions. A negative result may occur with  improper specimen collection/handling, submission of specimen other than nasopharyngeal swab, presence of viral mutation(s) within the areas targeted by this assay, and inadequate number of viral copies(<138 copies/mL). A negative result must be combined with clinical observations, patient history, and epidemiological information. The expected result is Negative.  Fact Sheet for Patients:  BloggerCourse.com  Fact Sheet for Healthcare Providers:   SeriousBroker.it  This test is no t yet approved or cleared by the [place] FDA and  has been authorized for detection and/or diagnosis of SARS-CoV-2 by FDA under an Emergency Use Authorization (EUA). This EUA will remain  in effect (meaning this test can be used) for the duration of the COVID-19 declaration under Section 564(b)(1) of the Act, 21 U.S.C.section 360bbb-3(b)(1), unless the authorization is terminated  or revoked sooner.       Influenza A by PCR NEGATIVE NEGATIVE Final   Influenza B by PCR NEGATIVE NEGATIVE Final    Comment: (NOTE) The Xpert Xpress SARS-CoV-2/FLU/RSV plus assay is intended as an aid in the diagnosis of influenza from Nasopharyngeal swab specimens and should not be used as a sole basis for treatment. Nasal washings and aspirates are unacceptable for Xpert Xpress SARS-CoV-2/FLU/RSV testing.  Fact Sheet for Patients: BloggerCourse.com  Fact Sheet for Healthcare Providers: SeriousBroker.it  This  test is not yet approved or cleared by the [place] and has been authorized for detection and/or diagnosis of SARS-CoV-2 by FDA under an Emergency Use Authorization (EUA). This EUA will remain in effect (meaning this test can be used) for the duration of the COVID-19 declaration under Section 564(b)(1) of the Act, 21 U.S.C. section 360bbb-3(b)(1), unless the authorization is terminated or revoked.  Performed at [facility], [address], [place], [place] [address]   MRSA PCR Screening     Status: None   Collection Time: 10/14/20  1:14 AM   Specimen: Nasopharyngeal  Result Value Ref Range Status   MRSA by PCR NEGATIVE NEGATIVE Final    Comment:        The GeneXpert MRSA Assay (FDA approved for NASAL specimens only), is one component of a comprehensive MRSA colonization surveillance program. It is not intended to diagnose MRSA infection nor to guide  or monitor treatment for MRSA infections. Performed at [facility], [address], [place], [place] [address]      Labs: BNP (last 3 results) Recent Labs    10/13/20 1847  BNP 216.1*   Basic Metabolic Panel: Recent Labs  Lab 10/13/20 1845 10/14/20 0420 10/15/20 0041  NA 136 137 137  K 4.0 4.0 4.2  CL 104 103 104  CO2 23 22 25   GLUCOSE 132* 134* 81  BUN 14 13 14   CREATININE 1.26* 1.20 1.07  CALCIUM 9.2 9.1 8.9  MG  --  2.2  --   PHOS  --  3.2  --    Liver Function Tests: Recent Labs  Lab 10/13/20 1845  AST 20  ALT 25  ALKPHOS 81  BILITOT 0.8  PROT 7.5  ALBUMIN 4.1   No results for input(s): LIPASE, AMYLASE in the last 168 hours. No results for input(s): AMMONIA in the last 168 hours. CBC: Recent Labs  Lab 10/13/20 1845 10/14/20 0420 10/15/20 0041 10/16/20 0034  WBC 13.6* 11.9* 10.0 9.0  NEUTROABS 10.6*  --   --   --   HGB 15.8 14.5 14.1 14.1  HCT 46.7 41.8 42.6 40.8  MCV 89.5 88.4 90.6 87.6  PLT 240 232 214 208   Cardiac Enzymes: No results for input(s): CKTOTAL, CKMB, CKMBINDEX, TROPONINI in the last 168 hours. BNP: Invalid input(s): POCBNP CBG: Recent Labs  Lab 10/14/20 0110  GLUCAP 126*   D-Dimer No results for input(s): DDIMER in the last 72 hours. Hgb A1c No results for input(s): HGBA1C in the last 72 hours. Lipid Profile Recent Labs    10/15/20 0041  CHOL 209*  HDL 45  LDLCALC 143*  TRIG 105  CHOLHDL 4.6   Thyroid function studies No results for input(s): TSH, T4TOTAL, T3FREE, THYROIDAB in the last 72 hours.  Invalid input(s): FREET3 Anemia work up No results for input(s): VITAMINB12, FOLATE, FERRITIN, TIBC, IRON, RETICCTPCT in the last 72 hours. Urinalysis    Component Value Date/Time   COLORURINE YELLOW 01/30/2019 1012   APPEARANCEUR CLEAR 01/30/2019 1012   LABSPEC 1.025 01/30/2019 1012   PHURINE 5.0 01/30/2019 1012   GLUCOSEU NEGATIVE 01/30/2019 1012   HGBUR NEGATIVE 01/30/2019 1012   BILIRUBINUR SMALL  (A) 01/30/2019 1012   BILIRUBINUR negative 12/04/2018 1656   KETONESUR TRACE (A) 01/30/2019 1012   PROTEINUR NEGATIVE 01/30/2019 1012   UROBILINOGEN 0.2 12/04/2018 1656   NITRITE NEGATIVE 01/30/2019 1012   LEUKOCYTESUR NEGATIVE 01/30/2019 1012   Sepsis Labs Invalid input(s): PROCALCITONIN,  WBC,  LACTICIDVEN Microbiology Recent  Results (from the past 240 hour(s))  Resp Panel by RT-PCR (Flu A&B, Covid) Nasopharyngeal Swab     Status: None   Collection Time: 10/13/20  7:57 PM   Specimen: Nasopharyngeal Swab; Nasopharyngeal(NP) swabs in vial transport medium  Result Value Ref Range Status   SARS Coronavirus 2 by RT PCR NEGATIVE NEGATIVE Final    Comment: (NOTE) SARS-CoV-2 target nucleic acids are NOT DETECTED.  The SARS-CoV-2 RNA is generally detectable in upper respiratory specimens during the acute phase of infection. The lowest concentration of SARS-CoV-2 viral copies this assay can detect is 138 copies/mL. A negative result does not preclude SARS-Cov-2 infection and should not be used as the sole basis for treatment or other patient management decisions. A negative result may occur with  improper specimen collection/handling, submission of specimen other than nasopharyngeal swab, presence of viral mutation(s) within the areas targeted by this assay, and inadequate number of viral copies(<138 copies/mL). A negative result must be combined with clinical observations, patient history, and epidemiological information. The expected result is Negative.  Fact Sheet for Patients:  BloggerCourse.com  Fact Sheet for Healthcare Providers:  SeriousBroker.it  This test is no t yet approved or cleared by the [place] FDA and  has been authorized for detection and/or diagnosis of SARS-CoV-2 by FDA under an Emergency Use Authorization (EUA). This EUA will remain  in effect (meaning this test can be used) for the duration of  the COVID-19 declaration under Section 564(b)(1) of the Act, 21 U.S.C.section 360bbb-3(b)(1), unless the authorization is terminated  or revoked sooner.       Influenza A by PCR NEGATIVE NEGATIVE Final   Influenza B by PCR NEGATIVE NEGATIVE Final    Comment: (NOTE) The Xpert Xpress SARS-CoV-2/FLU/RSV plus assay is intended as an aid in the diagnosis of influenza from Nasopharyngeal swab specimens and should not be used as a sole basis for treatment. Nasal washings and aspirates are unacceptable for Xpert Xpress SARS-CoV-2/FLU/RSV testing.  Fact Sheet for Patients: BloggerCourse.com  Fact Sheet for Healthcare Providers: SeriousBroker.it  This test is not yet approved or cleared by the [place] FDA and has been authorized for detection and/or diagnosis of SARS-CoV-2 by FDA under an Emergency Use Authorization (EUA). This EUA will remain in effect (meaning this test can be used) for the duration of the COVID-19 declaration under Section 564(b)(1) of the Act, 21 U.S.C. section 360bbb-3(b)(1), unless the authorization is terminated or revoked.  Performed at [facility], [address], [place], [place] [address]   MRSA PCR Screening     Status: None   Collection Time: 10/14/20  1:14 AM   Specimen: Nasopharyngeal  Result Value Ref Range Status   MRSA by PCR NEGATIVE NEGATIVE Final    Comment:        The GeneXpert MRSA Assay (FDA approved for NASAL specimens only), is one component of a comprehensive MRSA colonization surveillance program. It is not intended to diagnose MRSA infection nor to guide or monitor treatment for MRSA infections. Performed at [facility], [address], [place], [place] [address]     Time coordinating discharge: Over 30 minutes  SIGNED:  Arnetha Courser, MD  [facility] 10/16/2020, 9:41 AM  If 7PM-7AM, please contact night-coverage [email or web address]  This record has  been created using Conservation officer, historic buildings. Errors have been sought and corrected,but may not always be located. Such creation errors do not reflect on the standard of care.

## 2020-10-17 ENCOUNTER — Telehealth: Payer: Self-pay

## 2020-10-17 NOTE — Telephone Encounter (Signed)
Transition Care Management Follow-up Telephone Call  Date of discharge and from where: [facility] on 10/16/2020  How have you been since you were released from the hospital? Feeling GOOD  Any questions or concerns? No  Items Reviewed:  Did the pt receive and understand the discharge instructions provided? Yes   Medications obtained and verified? Yes   Other? N/A  Any new allergies since your discharge? Yes   Dietary orders reviewed? Yes  Do you have support at home? Yes friend and neighbors   Home Care and Equipment/Supplies: Were home health services ordered? NONE  Functional Questionnaire: (I = Independent and D = Dependent) ADLs: I    Follow up appointments reviewed:   PCP Hospital f/u appt confirmed? Yes  Scheduled to see  on NP Amy Zonia Kief  @ 11:10 am   Specialist Hospital f/u appt confirmed? Yes  Scheduled to see pulmonologist on 11/05/2020  Are transportation arrangements needed? No   If their condition worsens, is the pt aware to call PCP or go to the Emergency Dept.? Yes.Made pt aware if condition worsen or start experiencing rapid weight gain, chest pain, diff breathing, SOB, high fevers, or bleading to refer imediately to ED for further evaluation.   Was the patient provided with contact information for the PCP's office or ED? He has the phone number   Was the pt encouraged to call back with questions or concerns?yes

## 2020-10-23 NOTE — Progress Notes (Signed)
TRANSITION OF CARE VISIT   Primary Care Physician (PCP): [facility]        [address] [place], [address]       Phone: [phone]         Fax: [phone]  Date of Admission: 10/13/2020  Date of Discharge: 10/16/2020  Transitions of Care Call: 10/17/2020 by Viann Shove  Discharged from: [facility]  Discharge Diagnosis: Acute saddle pulmonary embolism [facility])  Summary of Admission:  65 year old white male that presented to the emergency room from home. 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 Patient states he has been more sedentary and had an issue with sciatica approximately 4 weeks ago. He noticed his left leg swelling more than the right and also being very sore. He states he felt that pain get immediately better at the same time he developed acute onset shortness of breath. He also experienced some substernal chest pain. He recognized his symptoms but had things to do at his house before he came to the emergency room. Symptoms were progressive through the day today. He had a near syncopal episode while attempting to walk to the bathroom and then 911 was called.  He was found to have DVT in left popliteal vein along with saddle PE bilaterally, echocardiogram with moderately reduced right ventricular function secondary to PE.  Patient remained hemodynamically stable, initially treated with heparin infusion and later transition to Eliquis.  He was saturating well on room air before discharge.  PCCM offered another day of monitoring but he refused stating that he wants to go home.  Patient was discharged on Eliquis and will follow up with his primary care provider for further recommendations.  TODAY's VISIT Today patient reports feeling better since hospital discharge. Reports taking  Eliquis as prescribed. Declines beginning cholesterol medication. Reports long history of high cholesterol even when he was in the [employer]. He is aware of appointment scheduled with Pulmonology on 11/05/2020.  PULMONARY EMBOLISM FOLLOW-UP:  Difficulty taking deep breaths: no Shortness of breath: no Chest pain: no Cough: yes  Orthopnea: no, using 1 pillow at bedtime Calf or thigh pain and/or swelling: reports measuring bilateral calves, both usually around 17 inches which is normal for him, swelling comes and goes  Wheezing: no Hemoptysis: no Bruising: no  DVT FOLLOW-UP:  Reports measuring bilateral calves, both usually around 17 inches which is normal for him, swelling comes and goes. Denies any near syncope/syncopal episodes since hospital discharge.    The 10-year ASCVD risk score Denman George DC Montez Hageman., et al., 2013) is: 8.7%   Values used to calculate the score:     Age: 65 years     Sex: Male     Is Non-Hispanic African American: No     Diabetic: No     Tobacco smoker: No     Systolic Blood Pressure: 101 mmHg     Is BP treated: No     HDL Cholesterol: 45 mg/dL     Total Cholesterol: 209 mg/dL  Patient/Caregiver self-reported problems/concerns: None  MEDICATIONS  Medication Reconciliation conducted with patient/caregiver? (Yes/ No): Yes  New medications prescribed/discontinued upon discharge? (Yes/No): Yes. Apixaban, Omeprazole, Polyethylene Glycol  Barriers identified related to medications: No  LABS  Lab Reviewed (Yes/No/NA): Yes  PHYSICAL EXAM:  Vitals with BMI 10/24/2020 10/16/2020 10/16/2020  Height - - -  Weight 249 lbs  10 oz - -  BMI 34.83 - -  Systolic 110 101 277  Diastolic 74 71 78  Pulse 74 82 84   General appearance - alert, well appearing, and in no distress and oriented to person, place, and time Mental status - alert, oriented to person, place, and time, normal mood, behavior, speech, dress, motor activity, and thought processes Neck - supple, no significant  adenopathy Chest - clear to auscultation, no wheezes, rales or rhonchi, symmetric air entry, no tachypnea, retractions or cyanosis Heart - normal rate, regular rhythm, normal S1, S2, no murmurs, rubs, clicks or gallops Neurological - alert, oriented, normal speech, no focal findings or movement disorder noted, neck supple without rigidity, cranial nerves II through XII intact, normal muscle tone, no tremors, strength 5/5 Musculoskeletal - no joint tenderness, deformity or swelling, no muscular tenderness noted, full range of motion without pain Extremities - peripheral pulses normal, no pedal edema, no clubbing or cyanosis, no edema, redness or tenderness in the calves Skin - normal coloration and turgor, no rashes, no suspicious skin lesions noted  ASSESSMENT: 1. Hospital discharge follow-up: - Patient reports feeling better since hospital discharge.   2. Acute saddle pulmonary embolism, unspecified whether acute cor pulmonale present [facility]): - Patient presents today in office without cardiorespiratory distress. - BMP to evaluate kidney function and electrolyte balance. - CBC to screen for anemia. - Keep appointment scheduled with Pulmonology on 11/05/2020. - Basic Metabolic Panel - CBC  3. Acute deep vein thrombosis (DVT) of both lower extremities, unspecified vein (HCC): - Patient presents today in office without cardiorespiratory distress. - Referral to Vascular Surgery for further evaluation and management.  - Ambulatory referral to Vascular Surgery  4. Hyperlipidemia, unspecified hyperlipidemia type: - Patient declined pharmacological therapy.    PATIENT EDUCATION PROVIDED: See AVS   FOLLOW-UP (Include any further testing or referrals): Follow-up with primary provider as scheduled. Keep appointment with Pulmonology 11/05/2020. Referral to Vascular Surgery.

## 2020-10-24 ENCOUNTER — Encounter: Payer: Self-pay | Admitting: Family

## 2020-10-24 ENCOUNTER — Ambulatory Visit (INDEPENDENT_AMBULATORY_CARE_PROVIDER_SITE_OTHER): Payer: Medicaid Other | Admitting: Family

## 2020-10-24 ENCOUNTER — Other Ambulatory Visit: Payer: Self-pay

## 2020-10-24 VITALS — BP 110/74 | HR 74 | Temp 97.7°F | Resp 16 | Wt 249.6 lb

## 2020-10-24 DIAGNOSIS — I82403 Acute embolism and thrombosis of unspecified deep veins of lower extremity, bilateral: Secondary | ICD-10-CM | POA: Diagnosis not present

## 2020-10-24 DIAGNOSIS — Z09 Encounter for follow-up examination after completed treatment for conditions other than malignant neoplasm: Secondary | ICD-10-CM | POA: Diagnosis not present

## 2020-10-24 DIAGNOSIS — E785 Hyperlipidemia, unspecified: Secondary | ICD-10-CM

## 2020-10-24 DIAGNOSIS — I2692 Saddle embolus of pulmonary artery without acute cor pulmonale: Secondary | ICD-10-CM | POA: Diagnosis not present

## 2020-10-24 MED ORDER — ATORVASTATIN CALCIUM 20 MG PO TABS: 20.0000 mg | ORAL_TABLET | Freq: Every day | ORAL | 0 refills | Status: DC

## 2020-10-24 NOTE — Patient Instructions (Signed)
Pulmonary Embolism  A pulmonary embolism (PE) is a sudden blockage or decrease of blood flow in one or both lungs that happens when a clot travels into the arteries of the lung (pulmonary arteries). Most blockages come from a blood clot that forms in the vein of a leg or arm (deep vein thrombosis, DVT) and travels to the lungs. A clot is blood that has thickened into a gel or solid. PE is a dangerous and life-threatening condition that needs to be treated right away. What are the causes? This condition is usually caused by a blood clot that forms in a vein and moves to the lungs. In rare cases, it may be caused by air, fat, part of a tumor, or other tissue that moves through the veins and into the lungs. What increases the risk? The following factors may make you more likely to develop this condition:  Experiencing a traumatic injury, such as breaking a hip or leg.  Having: ? A spinal cord injury. ? Major surgery, especially hip or knee replacement, or surgery on parts of the nervous system or on the abdomen. ? A stroke. ? A blood clotting disease. ? Long-term (chronic) lung or heart disease. ? Cancer, especially if you are being treated with chemotherapy. ? A central venous catheter.  Taking medicines that contain estrogen. These include birth control pills and hormone replacement therapy.  Being: ? Pregnant. ? In the period of time after your baby is delivered (postpartum). ? Older than age 60. ? Overweight. ? A smoker, especially if you have other risks. ? Not very active (sedentary), not being able to move at all, or spending long periods sitting, such as travel over 6 hours. You are also at a greater risk if you have a leg in a cast or splint. What are the signs or symptoms? Symptoms of this condition usually start suddenly and include:  Shortness of breath during activity or at rest.  Coughing, coughing up blood, or coughing up bloody mucus.  Chest pain, back pain, or  shoulder blade pain that gets worse with deep breaths.  Rapid or irregular heartbeat.  Feeling light-headed or dizzy, or fainting.  Feeling anxious.  Pain and swelling in a leg. This is a symptom of DVT, which can lead to PE. How is this diagnosed? This condition may be diagnosed based on your medical history, a physical exam, and tests. Tests may include:  Blood tests.  An ECG (electrocardiogram) of the heart.  A CT pulmonary angiogram. This test checks blood flow in and around your lungs.  A ventilation-perfusion scan, also called a lung VQ scan. This test measures air flow and blood flow to the lungs.  An ultrasound to check for a DVT. How is this treated? Treatment for this condition depends on many factors, such as the cause of your PE, your risk for bleeding or developing more clots, and other medical conditions you may have. Treatment aims to stop blood clots from forming or growing larger. In some cases, treatment may be aimed at breaking apart or removing the blood clot. Treatment may include:  Medicines, such as: ? Blood thinning medicines, also called anticoagulants, to stop clots from forming and growing. ? Medicines that break apart clots (thrombolytics).  Procedures, such as: ? Using a flexible tube to remove a blood clot (embolectomy) or to deliver medicine to destroy it (catheter-directed thrombolysis). ? Surgery to remove the clot (surgical embolectomy). This is rare. You may need a combination of immediate, long-term, and extended   treatments. Your treatment may continue for several months (maintenance therapy) or longer depending on your medical conditions. You and your health care provider will work together to choose the treatment program that is best for you. Follow these instructions at home: Medicines  Take over-the-counter and prescription medicines only as told by your health care provider.  If you are taking blood thinners: ? Talk with your health care  provider before you take any medicines that contain aspirin or NSAIDs, such as ibuprofen. These medicines increase your risk for dangerous bleeding. ? Take your medicine exactly as told, at the same time every day. ? Avoid activities that could cause injury or bruising, and follow instructions about how to prevent falls. ? Wear a medical alert bracelet or carry a card that lists what medicines you take.  Understand what foods and drugs interact with any medicines that you are taking. General instructions  Ask your health care provider when you may return to your normal activities. Avoid sitting or lying for a long time without moving.  Maintain a healthy weight. Ask your health care provider what weight is healthy for you.  Do not use any products that contain nicotine or tobacco, such as cigarettes, e-cigarettes, and chewing tobacco. If you need help quitting, ask your health care provider.  Talk with your health care provider about any travel plans. It is important to make sure that you are still able to take your medicine while traveling.  Keep all follow-up visits as told by your health care provider. This is important. Where to find more information  American Lung Association: www.lung.org  Centers for Disease Control and Prevention: www.cdc.gov Contact a health care provider if:  You missed a dose of your blood thinner medicine. Get help right away if you:  Have: ? New or increased pain, swelling, warmth, or redness in an arm or leg. ? Shortness of breath that gets worse during activity or at rest. ? A fever. ? Worsening chest pain. ? A rapid or irregular heartbeat. ? A severe headache. ? Vision changes. ? A serious fall or accident, or you hit your head. ? Stomach pain. ? Blood in your vomit, stool, or urine. ? A cut that will not stop bleeding.  Cough up blood.  Feel light-headed or dizzy, and that feeling does not go away.  Cannot move your arms or legs.  Are  confused or have memory loss. These symptoms may represent a serious problem that is an emergency. Do not wait to see if the symptoms will go away. Get medical help right away. Call your local emergency services (911 in the [place] Do not drive yourself to the hospital. Summary  A pulmonary embolism (PE) is a serious and potentially life-threatening condition, in which a blood clot from one part of the body (deep vein thrombosis, DVT) travels to the arteries of the lung, causing a sudden blockage or decrease of blood flow to the lungs. This may result in shortness of breath, chest pain, dizziness, and fainting.  Treatments for this condition usually include medicines to thin your blood (anticoagulants) or medicines to break apart blood clots (thrombolytics).  If you are given blood thinners, take your medicine exactly as told by your health care provider, at the same time every day. This is important.  Understand what foods and drugs interact with any medicines that you are taking.  If you have signs of PE or DVT, call your local emergency services (911 in the [place] This information is not   intended to replace advice given to you by your health care provider. Make sure you discuss any questions you have with your health care provider. Document Revised: 04/06/2019 Document Reviewed: 04/06/2019 Elsevier Patient Education  2021 [employer]vier Inc.  

## 2020-10-25 LAB — CBC
Hematocrit: 43.7 % (ref 37.5–51.0)
Hemoglobin: 14.1 g/dL (ref 13.0–17.7)
MCH: 29.1 pg (ref 26.6–33.0)
MCHC: 32.3 g/dL (ref 31.5–35.7)
MCV: 90 fL (ref 79–97)
Platelets: 358 10*3/uL (ref 150–450)
RBC: 4.85 x10E6/uL (ref 4.14–5.80)
RDW: 12.7 % (ref 11.6–15.4)
WBC: 6.3 10*3/uL (ref 3.4–10.8)

## 2020-10-25 LAB — BASIC METABOLIC PANEL
BUN/Creatinine Ratio: 10 (ref 10–24)
BUN: 11 mg/dL (ref 8–27)
CO2: 20 mmol/L (ref 20–29)
Calcium: 9.4 mg/dL (ref 8.6–10.2)
Chloride: 99 mmol/L (ref 96–106)
Creatinine, Ser: 1.06 mg/dL (ref 0.76–1.27)
Glucose: 133 mg/dL — ABNORMAL HIGH (ref 65–99)
Potassium: 4.5 mmol/L (ref 3.5–5.2)
Sodium: 141 mmol/L (ref 134–144)
eGFR: 78 mL/min/{1.73_m2} (ref >59)

## 2020-10-25 NOTE — Progress Notes (Signed)
Kidney function normal.   No anemia.

## 2020-11-05 ENCOUNTER — Encounter: Payer: Self-pay | Admitting: Pulmonary Disease

## 2020-11-05 ENCOUNTER — Ambulatory Visit (INDEPENDENT_AMBULATORY_CARE_PROVIDER_SITE_OTHER): Payer: Medicaid Other | Admitting: Pulmonary Disease

## 2020-11-05 ENCOUNTER — Other Ambulatory Visit: Payer: Self-pay

## 2020-11-05 VITALS — BP 124/72 | HR 65 | Temp 97.6°F | Ht 71.0 in | Wt 255.8 lb

## 2020-11-05 DIAGNOSIS — I824Y3 Acute embolism and thrombosis of unspecified deep veins of proximal lower extremity, bilateral: Secondary | ICD-10-CM | POA: Diagnosis not present

## 2020-11-05 DIAGNOSIS — I2692 Saddle embolus of pulmonary artery without acute cor pulmonale: Secondary | ICD-10-CM | POA: Diagnosis not present

## 2020-11-05 NOTE — Patient Instructions (Signed)
Nice to meet you  And glad you are feeling better  We will get an ultrasound of your legs as well as ultrasound your heart in early August and follow-up in clinic with me around that time to discuss results.  I anticipate the clots will be improved.  If so, can likely discuss timing of stopping the blood thinner.   Return to clinic in 2 months with Dr. Judeth Horn after heart ultrasound (echocardiogram) and lower extremity Dopplers are performed.

## 2020-11-05 NOTE — Progress Notes (Signed)
@Patient  ID: , male    DOB: 10-28-1955, 65 y.o.   MRN: [id number]  Chief Complaint  Patient presents with  . Hospitalization Follow-up    HFU for PE. States he has been doing well since being home. Has been able to go back to his regular activities.     Referring provider: No ref. provider found  HPI:   65 year old whom we are seeing in hospital follow-up for PE.  H&P and discharge summary reviewed.  Pulmonary consult note reviewed.  Patient reports an 2012 shortly after knee replacement developed a lower extremity swelling and chest pain.  Took several days for evaluation but eventually diagnosed with acute left DVT as well as PE.  Completed course of anticoagulation with improvement.  Since then has had chronic half to three-quarter inch swelling compared to right lower extremity of the left lower extremity.  He was less active during the winter due to the cold.  When tried to become more active during the spring had bad bout of sciatica, low back pain.  Is present for several weeks.  Would spend majority of time up to 14 hours a day sitting in a chair without moving.  He developed symptoms of lower extremity swelling and similar symptoms to PE in the past.  Presented to ED.  CTA on my interpretation revealed left upper lobe infiltrate and large bilateral clot burden in pulmonary arteries.  Lower extremity Doppler at that time that this revealed right DVT that is acute as well as a age-indeterminate left DVT.  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  He was started on heparin.  TTE revealed RV dysfunction..  Symptoms are markedly better.  He is back to exercising.  Trying to lose weight.  Being followed by primary care doctor.  Most recent PCP note reviewed.  Denies any issues that hold him back from exercising.   Questionaires / Pulmonary Flowsheets:   ACT:  No flowsheet data found.  MMRC: mMRC Dyspnea Scale mMRC Score   11/05/2020 0    Epworth:  No flowsheet data found.  Tests:   FENO:  No results found for: NITRICOXIDE  PFT: No flowsheet data found.  WALK:  No flowsheet data found.  Imaging: Personally reviewed as per EMR discussion this note CT Angio Chest PE W and/or Wo Contrast  Result Date: 10/13/2020 CLINICAL DATA:  Shortness of breath EXAM: CT ANGIOGRAPHY CHEST WITH CONTRAST TECHNIQUE: Multidetector CT imaging of the chest was performed using the standard protocol during bolus administration of intravenous contrast. Multiplanar CT image reconstructions and MIPs were obtained to evaluate the vascular anatomy. CONTRAST:  71mL OMNIPAQUE IOHEXOL 350 MG/ML SOLN COMPARISON:  Chest x-ray from earlier in the same day. FINDINGS: Cardiovascular: Thoracic aorta shows mild atherosclerotic change without aneurysmal dilatation or dissection. Pulmonary artery is prominent and demonstrates significant bilateral pulmonary emboli with saddle component bridging the bifurcation of the pulmonary artery. The RV/LV ratio is calculated at 3. Increased central venous pressure is noted with collateralization in the mediastinum. Mild coronary calcifications are noted. Mediastinum/Nodes: Thoracic inlet is within normal limits. No sizable hilar or mediastinal adenopathy is noted. The esophagus as visualized is within normal limits. Lungs/Pleura: Lungs are well aerated bilaterally. Patchy airspace opacity is noted in the medial aspect of the left upper lobe adjacent to the mediastinum which may represent some focal edema or early pneumonic infiltrate. Upper Abdomen: Visualized upper abdomen shows upper pole cyst in the left kidney. No acute abnormality  is noted. Musculoskeletal: Degenerative changes of the thoracic spine are noted. Review of the MIP images confirms the above findings. IMPRESSION: Significant bilateral pulmonary emboli with evidence of saddle component centrally. Elevated RV/LV ratio is noted of 3. Patchy airspace  opacity in the medial aspect of the left upper lobe which may represent edema or early infiltrate. Aortic Atherosclerosis (ICD10-I70.0). Critical Value/emergent results were called by telephone at the time of interpretation on 10/13/2020 at 9:33 pm to Dr. Marylouise Stacks, who verbally acknowledged these results. Electronically Signed   By: Alcide Clever M.D.   On: 10/13/2020 21:37   DG Chest Port 1 View  Result Date: 10/13/2020 CLINICAL DATA:  Chest pain and shortness of breath EXAM: PORTABLE CHEST 1 VIEW COMPARISON:  None. FINDINGS: The heart size and mediastinal contours are within normal limits. Both lungs are clear. The visualized skeletal structures are unremarkable. IMPRESSION: No active disease. Electronically Signed   By: Alcide Clever M.D.   On: 10/13/2020 19:20   ECHOCARDIOGRAM COMPLETE  Result Date: 10/14/2020    ECHOCARDIOGRAM REPORT   Patient Name:   JOBIN MONTELONGO Date of Exam: 10/14/2020 Medical Rec #:  [id number]      Height:       71.0 in Accession #:    [id number]     Weight:       240.1 lb Date of Birth:  04-19-1956      BSA:          2.279 m Patient Age:    65 years       BP:           110/72 mmHg Patient Gender: M              HR:           88 bpm. Exam Location:  Inpatient Procedure: 2D Echo, Cardiac Doppler, Color Doppler and Intracardiac            Opacification Agent Indications:    Pulmonary Embolus I26.09  History:        Patient has prior history of Echocardiogram examinations. DVT.                 Pulmonary embolism.  Sonographer:    Leta Jungling RDCS Referring Phys: [id number] CHRISTOPHER R DOROTHY IMPRESSIONS  1. Left ventricular ejection fraction, by estimation, is 65 to 70%. The left ventricle has normal function. The left ventricle has no regional wall motion abnormalities. There is mild left ventricular hypertrophy of the basal-septal segment. Left ventricular diastolic parameters are consistent with Grade I diastolic dysfunction (impaired relaxation). There is the interventricular  septum is flattened in systole, consistent with right ventricular pressure overload.  2. McConnell's sign is present: hemodynamically important pulmonary embolism. Right ventricular systolic function is moderately reduced. The right ventricular size is moderately enlarged. There is mildly elevated pulmonary artery systolic pressure.  3. The mitral valve is normal in structure. No evidence of mitral valve regurgitation. No evidence of mitral stenosis.  4. The aortic valve is normal in structure. Aortic valve regurgitation is not visualized. No aortic stenosis is present.  5. The inferior vena cava is dilated in size with <50% respiratory variability, suggesting right atrial pressure of 15 mmHg. FINDINGS  Left Ventricle: Left ventricular ejection fraction, by estimation, is 65 to 70%. The left ventricle has normal function. The left ventricle has no regional wall motion abnormalities. Definity contrast agent was given IV to delineate the left ventricular  endocardial borders. The left ventricular internal cavity size was normal  in size. There is mild left ventricular hypertrophy of the basal-septal segment. The interventricular septum is flattened in systole, consistent with right ventricular pressure overload. Left ventricular diastolic parameters are consistent with Grade I diastolic dysfunction (impaired relaxation). Normal left ventricular filling pressure. Right Ventricle: McConnell's sign is present: hemodynamically important pulmonary embolism. The right ventricular size is moderately enlarged. No increase in right ventricular wall thickness. Right ventricular systolic function is moderately reduced. There is mildly elevated pulmonary artery systolic pressure. The tricuspid regurgitant velocity is 3.20 m/s, and with an assumed right atrial pressure of 3 mmHg, the estimated right ventricular systolic pressure is 44.0 mmHg. Left Atrium: Left atrial size was normal in size. Right Atrium: Right atrial size was  normal in size. Pericardium: There is no evidence of pericardial effusion. Mitral Valve: The mitral valve is normal in structure. No evidence of mitral valve regurgitation. No evidence of mitral valve stenosis. Tricuspid Valve: The tricuspid valve is normal in structure. Tricuspid valve regurgitation is not demonstrated. No evidence of tricuspid stenosis. Aortic Valve: The aortic valve is normal in structure. Aortic valve regurgitation is not visualized. No aortic stenosis is present. Pulmonic Valve: The pulmonic valve was normal in structure. Pulmonic valve regurgitation is not visualized. No evidence of pulmonic stenosis. Aorta: The aortic root is normal in size and structure. Venous: The inferior vena cava is dilated in size with less than 50% respiratory variability, suggesting right atrial pressure of 15 mmHg. IAS/Shunts: No atrial level shunt detected by color flow Doppler.  LEFT VENTRICLE PLAX 2D LVIDd:         4.30 cm  Diastology LVIDs:         3.50 cm  LV e' medial:    5.66 cm/s LV PW:         0.90 cm  LV E/e' medial:  9.7 LV IVS:        1.30 cm  LV e' lateral:   9.36 cm/s LVOT diam:     2.00 cm  LV E/e' lateral: 5.9 LV SV:         41 LV SV Index:   18 LVOT Area:     3.14 cm  RIGHT VENTRICLE RV S prime:     11.10 cm/s TAPSE (M-mode): 1.4 cm LEFT ATRIUM             Index LA diam:        2.90 cm 1.27 cm/m LA Vol (A2C):   31.9 ml 14.00 ml/m LA Vol (A4C):   41.4 ml 18.17 ml/m LA Biplane Vol: 36.8 ml 16.15 ml/m  AORTIC VALVE LVOT Vmax:   96.10 cm/s LVOT Vmean:  66.200 cm/s LVOT VTI:    0.131 m  AORTA Ao Root diam: 3.60 cm MITRAL VALVE               TRICUSPID VALVE MV Area (PHT): 3.53 cm    TR Peak grad:   41.0 mmHg MV Decel Time: 215 msec    TR Vmax:        320.00 cm/s MV E velocity: 54.80 cm/s MV A velocity: 68.10 cm/s  SHUNTS MV E/A ratio:  0.80        Systemic VTI:  0.13 m                            Systemic Diam: 2.00 cm Rachelle Hora Croitoru MD Electronically signed by Thurmon Fair MD Signature Date/Time:  10/14/2020/2:02:40 PM    Final  VAS [place] LOWER EXTREMITY VENOUS (DVT)  Result Date: 10/14/2020  Lower Venous DVT Study Patient Name:  MASSIAH MINJARES  Date of Exam:   10/14/2020 Medical Rec #: [id number]       Accession #:    [id number] Date of Birth: [birth year]       Patient Gender: M Patient Age:   73Y Exam Location:  [facility] Procedure:      VAS [place] LOWER EXTREMITY VENOUS (DVT) Referring Phys: [id number] CHRISTOPHER R DOROTHY --------------------------------------------------------------------------------  Indications: Pulmonary embolism, and left lower extremity edema x months per patient.  Comparison Study: 10/01/2010 left lower extremity venous duplex- extensive DVT                   throughout left lower extremity. Performing Technologist: Gertie Fey MHA, RDMS, RVT, RDCS  Examination Guidelines: A complete evaluation includes B-mode imaging, spectral Doppler, color Doppler, and power Doppler as needed of all accessible portions of each vessel. Bilateral testing is considered an integral part of a complete examination. Limited examinations for reoccurring indications may be performed as noted. The reflux portion of the exam is performed with the patient in reverse Trendelenburg.  +---------+---------------+---------+-----------+----------+--------------+ RIGHT    CompressibilityPhasicitySpontaneityPropertiesThrombus Aging +---------+---------------+---------+-----------+----------+--------------+ CFV      Full           No       Yes                                 +---------+---------------+---------+-----------+----------+--------------+ SFJ      Full                                                        +---------+---------------+---------+-----------+----------+--------------+ FV Prox  Full                                                        +---------+---------------+---------+-----------+----------+--------------+ FV Mid   Full                                                         +---------+---------------+---------+-----------+----------+--------------+ FV DistalNone                    No                   Acute          +---------+---------------+---------+-----------+----------+--------------+ PFV      Full                                                        +---------+---------------+---------+-----------+----------+--------------+ POP                              No  Acute, mobile  +---------+---------------+---------+-----------+----------+--------------+ PTV                              No                   Acute          +---------+---------------+---------+-----------+----------+--------------+ PERO                             No                   Acute          +---------+---------------+---------+-----------+----------+--------------+ Gastroc                          No                   Acute          +---------+---------------+---------+-----------+----------+--------------+   +---------+---------------+---------+-----------+------------+-----------------+ LEFT     CompressibilityPhasicitySpontaneityProperties  Thrombus Aging    +---------+---------------+---------+-----------+------------+-----------------+ CFV      Partial        No       Yes                    Age Indeterminate +---------+---------------+---------+-----------+------------+-----------------+ SFJ      Partial                                        Age Indeterminate +---------+---------------+---------+-----------+------------+-----------------+ FV Prox  None                               retracted   Age Indeterminate +---------+---------------+---------+-----------+------------+-----------------+ FV Mid   None                                           Age Indeterminate +---------+---------------+---------+-----------+------------+-----------------+ FV DistalNone                                            Age Indeterminate +---------+---------------+---------+-----------+------------+-----------------+ PFV      None                                           Age Indeterminate +---------+---------------+---------+-----------+------------+-----------------+ POP      None                               softly      Age Indeterminate                                             echogenic                     +---------+---------------+---------+-----------+------------+-----------------+ PTV      None  softly      Age Indeterminate                                             echogenic                     +---------+---------------+---------+-----------+------------+-----------------+ PERO                                        Not                                                                       visualized                    +---------+---------------+---------+-----------+------------+-----------------+ Gastroc  None                    No                     Age Indeterminate +---------+---------------+---------+-----------+------------+-----------------+     Summary: RIGHT: - Findings consistent with acute deep vein thrombosis involving the right femoral vein, right popliteal vein, right posterior tibial veins, right peroneal veins, and right gastrocnemius veins. DVT in the popliteal vein is mobile. - No cystic structure found in the popliteal fossa.  LEFT: - Findings consistent with age indeterminate deep vein thrombosis involving the left common femoral vein, SF junction, left femoral vein, left proximal profunda vein, left popliteal vein, left posterior tibial veins, and left gastrocnemius veins.  Pulsatile lower extremity venous flow is suggestive of elevated right heart pressure. *See table(s) above for measurements and observations. Electronically signed by Gretta Began MD on 10/14/2020 at 3:47:21 PM.    Final      Lab Results: Personally reviewed and as per EMR CBC    Component Value Date/Time   WBC 6.3 10/24/2020 1151   WBC 9.0 10/16/2020 0034   RBC 4.85 10/24/2020 1151   RBC 4.66 10/16/2020 0034   HGB 14.1 10/24/2020 1151   HCT 43.7 10/24/2020 1151   PLT 358 10/24/2020 1151   MCV 90 10/24/2020 1151   MCH 29.1 10/24/2020 1151   MCH 30.3 10/16/2020 0034   MCHC 32.3 10/24/2020 1151   MCHC 34.6 10/16/2020 0034   RDW 12.7 10/24/2020 1151   LYMPHSABS 1.3 10/13/2020 1845   LYMPHSABS 2.2 12/04/2018 1532   MONOABS 1.2 (H) 10/13/2020 1845   EOSABS 0.4 10/13/2020 1845   EOSABS 0.4 12/04/2018 1532   BASOSABS 0.1 10/13/2020 1845   BASOSABS 0.1 12/04/2018 1532    BMET    Component Value Date/Time   NA 141 10/24/2020 1151   K 4.5 10/24/2020 1151   CL 99 10/24/2020 1151   CO2 20 10/24/2020 1151   GLUCOSE 133 (H) 10/24/2020 1151   GLUCOSE 81 10/15/2020 0041   BUN 11 10/24/2020 1151   CREATININE 1.06 10/24/2020 1151   CALCIUM 9.4 10/24/2020 1151   GFRNONAA >60 10/15/2020 0041   GFRAA >60 05/10/2018 0537    BNP    Component Value  Date/Time   BNP 216.1 (H) 10/13/2020 1847    ProBNP No results found for: PROBNP  Specialty Problems   None     No Known Allergies   There is no immunization history on file for this patient.  Past Medical History:  Diagnosis Date  . History of pulmonary embolus (PE)   . SBO (small bowel obstruction) (HCC) 05/2018    Tobacco History: Social History   Tobacco Use  Smoking Status Never Smoker  Smokeless Tobacco Never Used   Counseling given: Not Answered   Continue to not smoke  Outpatient Encounter Medications as of 11/05/2020  Medication Sig  . APIXABAN (ELIQUIS) VTE STARTER PACK (10MG  AND 5MG ) Take as directed on package: start with two-5mg  tablets twice daily for 7 days. On day 8, switch to one-5mg  tablet twice daily.  Marland Kitchen. omeprazole (PRILOSEC) 20 MG capsule Take 1 capsule (20 mg total) by mouth daily. To reduce stomach acid   No  facility-administered encounter medications on file as of 11/05/2020.     Review of Systems  Review of Systems  No chest pain.  No shortness of breath.  Stable left swelling compared to right lower extremity.  Comprehensive review systems otherwise negative Physical Exam  BP 124/72   Pulse 65   Temp 97.6 F (36.4 C) (Temporal)   Ht 5\' 11"  (1.803 m)   Wt 255 lb 12.8 oz (116 kg)   SpO2 98% Comment: on RA  BMI 35.68 kg/m   Wt Readings from Last 5 Encounters:  11/05/20 255 lb 12.8 oz (116 kg)  10/24/20 249 lb 9.6 oz (113.2 kg)  10/16/20 213 lb 3 oz (96.7 kg)  01/30/19 248 lb (112.5 kg)  10/05/18 239 lb 9.6 oz (108.7 kg)    BMI Readings from Last 5 Encounters:  11/05/20 35.68 kg/m  10/24/20 34.81 kg/m  10/16/20 29.73 kg/m  01/30/19 34.59 kg/m  10/05/18 33.42 kg/m     Physical Exam General: Well-appearing, no distress Eyes: EOMI, icterus Neck: Supple no JVP Cardiovascular: Regular rate and rhythm, no murmur Pulmonary: Clear to auscultation bilaterally, no wheezing, normal work of breathing Abdomen: Nondistended, bowel sounds present MSK: No synovitis, joint effusion, reduced range of motion extension and flexion right elbow Neuro: Normal gait, no weakness Psych: Normal mood, full affect    Assessment & Plan:   Pulmonary embolism: Provoked in the setting of sedentary state with sciatica and low back pain.  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  2 provoked.  Could consider likely coagulation.  However, he is a avid bike rider and worry about the risks of anticoagulation.  We will repeat lower extremity Dopplers and echocardiogram as below in 2 months and decide at that point time.  Elevated pulmonary pressures: In setting of submassive PE.  Improved symptoms of dyspnea.  Suspect improving.  Echocardiogram 01/2021.  If elevated pressures would need further evaluation for CTEPH.   Return in about 2 months (around 01/05/2021).   Karren BurlyMatthew R Duron Meister,  MD 11/05/2020   I spent 63 minutes caring for the patient including review of records, face-to-face care, coordination of care.

## 2020-11-06 ENCOUNTER — Ambulatory Visit (HOSPITAL_COMMUNITY)
Admission: RE | Admit: 2020-11-06 | Discharge: 2020-11-06 | Disposition: A | Discharge status: Home / Self Care | Payer: Medicaid Other | Source: Ambulatory Visit | Attending: Pulmonary Disease | Admitting: Pulmonary Disease

## 2020-11-06 ENCOUNTER — Ambulatory Visit (HOSPITAL_BASED_OUTPATIENT_CLINIC_OR_DEPARTMENT_OTHER)
Admission: RE | Admit: 2020-11-06 | Discharge: 2020-11-06 | Disposition: A | Discharge status: Home / Self Care | Payer: Medicaid Other | Source: Ambulatory Visit | Attending: Pulmonary Disease | Admitting: Pulmonary Disease

## 2020-11-06 DIAGNOSIS — I2692 Saddle embolus of pulmonary artery without acute cor pulmonale: Secondary | ICD-10-CM | POA: Insufficient documentation

## 2020-11-06 LAB — ECHOCARDIOGRAM COMPLETE
Area-P 1/2: 3.91 cm2
Calc EF: 60 %
S' Lateral: 2.65 cm
Single Plane A2C EF: 61.6 %
Single Plane A4C EF: 57.8 %

## 2020-11-14 ENCOUNTER — Other Ambulatory Visit: Payer: Self-pay | Admitting: Pulmonary Disease

## 2020-11-14 ENCOUNTER — Other Ambulatory Visit: Payer: Self-pay | Admitting: Adult Health

## 2020-11-14 MED ORDER — APIXABAN 5 MG PO TABS: 5.0000 mg | ORAL_TABLET | Freq: Two times a day (BID) | ORAL | 3 refills | Status: DC

## 2020-11-14 NOTE — Telephone Encounter (Signed)
Yes is suppose to be on Eliquis 5mg  Twice daily  , please send rx w/ 3 refills  Make sure he has follow up with [facility]

## 2020-11-14 NOTE — Telephone Encounter (Signed)
My prefered pharmacy is the [facility] on [address].  thanks   Hello Ronnie Carey, it looks like from your last office visit with Dr. Judeth Horn on 11/05/2020, he wanted to run some test in the next 2 months and re-evaluate stopping the medication so I will go ahead and send in a refill for you. What is the preferred pharmacy?   Thanks! [facility]   Is this prescription going to be continued?  if so, I have only 2 days left on the original prescription  Tammy parrett please advise: looks like the patient was put on starter pack of Eliquis and only has 2 pills left and Dr. Judeth Horn wants him on it until next visit in august. Dr. Judeth Horn is off, what dosage does he need to be on now since he finished the starter pack so I can send in the refill. Thanks so much!

## 2021-03-03 ENCOUNTER — Encounter: Payer: Self-pay | Admitting: Family

## 2021-03-05 ENCOUNTER — Encounter: Payer: Self-pay | Admitting: Pulmonary Disease

## 2021-03-05 ENCOUNTER — Ambulatory Visit (INDEPENDENT_AMBULATORY_CARE_PROVIDER_SITE_OTHER): Payer: Medicaid Other | Admitting: Pulmonary Disease

## 2021-03-05 ENCOUNTER — Other Ambulatory Visit: Payer: Self-pay

## 2021-03-05 VITALS — BP 110/70 | HR 85 | Temp 98.7°F | Ht 71.0 in | Wt 248.6 lb

## 2021-03-05 DIAGNOSIS — I2699 Other pulmonary embolism without acute cor pulmonale: Secondary | ICD-10-CM

## 2021-03-05 NOTE — Progress Notes (Signed)
@Patient  ID: , male    DOB: 10/29/1955, 65 y.o.   MRN: [id number]  Chief Complaint  Patient presents with   Follow-up    Sob, pain when breathing.     Referring provider: [id number], NP  HPI:   65 y.o. whom we are seeing in in follow-up of submassive PE.  Overall doing well.  No breathing complaints.  Finished 30-month course of anticoagulation about 4 days ago.  Has some chest pain on the left.  Reproducible with palpation at home.  Confirmed on exam today.  Reviewed result of TTE 11/2020.  Images and results reviewed.  This was done prematurely, have planned 01/2021 at 27-month follow-up.  However, even short and short interval, RV function had normalized, mild RV dilation was seen.  This had him concerned.  Reason for further follow-up.  HPI at initial visit: Patient reports an 2012 shortly after knee replacement developed a lower extremity swelling and chest pain.  Took several days for evaluation but eventually diagnosed with acute left DVT as well as PE.  Completed course of anticoagulation with improvement.  Since then has had chronic half to three-quarter inch swelling compared to right lower extremity of the left lower extremity.  He was less active during the winter due to the cold.  When tried to become more active during the spring had bad bout of sciatica, low back pain.  Is present for several weeks.  Would spend majority of time up to 14 hours a day sitting in a chair without moving.  He developed symptoms of lower extremity swelling and similar symptoms to PE in the past.  Presented to ED.  CTA on my interpretation revealed left upper lobe infiltrate and large bilateral clot burden in pulmonary arteries.  Lower extremity Doppler at that time that this revealed right DVT that is acute as well as a age-indeterminate left DVT.  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  He was started on heparin.  TTE revealed RV  dysfunction..  Symptoms are markedly better.  He is back to exercising.  Trying to lose weight.  Being followed by primary care doctor.  Most recent PCP note reviewed.  Denies any issues that hold him back from exercising.   Questionaires / Pulmonary Flowsheets:   ACT:  No flowsheet data found.  MMRC: mMRC Dyspnea Scale mMRC Score  11/05/2020 0    Epworth:  No flowsheet data found.  Tests:   FENO:  No results found for: NITRICOXIDE  PFT: No flowsheet data found.  WALK:  No flowsheet data found.  Imaging: Personally reviewed as per EMR discussion this note No results found.   Lab Results: Personally reviewed and as per EMR CBC    Component Value Date/Time   WBC 6.3 10/24/2020 1151   WBC 9.0 10/16/2020 0034   RBC 4.85 10/24/2020 1151   RBC 4.66 10/16/2020 0034   HGB 14.1 10/24/2020 1151   HCT 43.7 10/24/2020 1151   PLT 358 10/24/2020 1151   MCV 90 10/24/2020 1151   MCH 29.1 10/24/2020 1151   MCH 30.3 10/16/2020 0034   MCHC 32.3 10/24/2020 1151   MCHC 34.6 10/16/2020 0034   RDW 12.7 10/24/2020 1151   LYMPHSABS 1.3 10/13/2020 1845   LYMPHSABS 2.2 12/04/2018 1532   MONOABS 1.2 (H) 10/13/2020 1845   EOSABS 0.4 10/13/2020 1845   EOSABS 0.4 12/04/2018 1532   BASOSABS 0.1 10/13/2020 1845   BASOSABS 0.1 12/04/2018 1532  BMET    Component Value Date/Time   NA 141 10/24/2020 1151   K 4.5 10/24/2020 1151   CL 99 10/24/2020 1151   CO2 20 10/24/2020 1151   GLUCOSE 133 (H) 10/24/2020 1151   GLUCOSE 81 10/15/2020 0041   BUN 11 10/24/2020 1151   CREATININE 1.06 10/24/2020 1151   CALCIUM 9.4 10/24/2020 1151   GFRNONAA >60 10/15/2020 0041   GFRAA >60 05/10/2018 0537    BNP    Component Value Date/Time   BNP 216.1 (H) 10/13/2020 1847    ProBNP No results found for: PROBNP  Specialty Problems   None  No Known Allergies  Immunization History  Administered Date(s) Administered   Hepatitis A, Ped/Adol-2 Dose 11/30/2017    Past Medical History:   Diagnosis Date   History of pulmonary embolus (PE)    SBO (small bowel obstruction) (HCC) 05/2018    Tobacco History: Social History   Tobacco Use  Smoking Status Never  Smokeless Tobacco Never   Counseling given: Not Answered   Continue to not smoke  Outpatient Encounter Medications as of 03/05/2021  Medication Sig   apixaban (ELIQUIS) 5 MG TABS tablet Take 1 tablet (5 mg total) by mouth 2 (two) times daily.   APIXABAN (ELIQUIS) VTE STARTER PACK (10MG  AND 5MG ) Take as directed on package: start with two-5mg  tablets twice daily for 7 days. On day 8, switch to one-5mg  tablet twice daily.   omeprazole (PRILOSEC) 20 MG capsule Take 1 capsule (20 mg total) by mouth daily. To reduce stomach acid   No facility-administered encounter medications on file as of 03/05/2021.     Review of Systems  Review of Systems  N/a Physical Exam  BP 110/70 (BP Location: Left Arm, Patient Position: Sitting, Cuff Size: Normal)   Pulse 85   Temp 98.7 F (37.1 C) (Oral)   Ht 5\' 11"  (1.803 m)   Wt 248 lb 9.6 oz (112.8 kg)   SpO2 96%   BMI 34.67 kg/m   Wt Readings from Last 5 Encounters:  03/05/21 248 lb 9.6 oz (112.8 kg)  11/05/20 255 lb 12.8 oz (116 kg)  10/24/20 249 lb 9.6 oz (113.2 kg)  10/16/20 213 lb 3 oz (96.7 kg)  01/30/19 248 lb (112.5 kg)    BMI Readings from Last 5 Encounters:  03/05/21 34.67 kg/m  11/05/20 35.68 kg/m  10/24/20 34.81 kg/m  10/16/20 29.73 kg/m  01/30/19 34.59 kg/m     Physical Exam General: Well-appearing, no distress Eyes: EOMI, icterus Neck: Supple no JVP Cardiovascular: Regular rate and rhythm, no murmur Pulmonary: Clear to auscultation bilaterally, no wheezing, normal work of breathing Abdomen: Nondistended, bowel sounds present MSK: No synovitis, joint effusion, reduced range of motion extension and flexion right elbow Neuro: Normal gait, no weakness Psych: Normal mood, full affect    Assessment & Plan:   Pulmonary embolism: Provoked  in the setting of sedentary state with sciatica and low back pain.  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  2 provoked.  Could consider lifelong coagulation.  However, he is a avid motorcyclist and worry about the risks of anticoagulation.  Has completed 3 months of anticoagulation.  Symptoms and RV function improved.  Elevated pulmonary pressures: In setting of submassive PE.  Improved symptoms of dyspnea.  RV pressures normalized on repeat TTE 11/2020.  Mild RV dilation persisted.  This was done sooner than anticipated had planned 11-month follow-up 01/2021.  We will repeat limited echocardiogram to evaluate RV, ordered today.   Return if symptoms  worsen or fail to improve.   Karren Burly, MD 03/05/2021

## 2021-03-05 NOTE — Patient Instructions (Addendum)
Nice to see you  We will repeat a limited ultrasound of the right side of the heart since the prior was a bit premature. The good news is that the most recent showed signs of improvement already.   If the ultrasound is improved no need for follow up.

## 2021-03-09 NOTE — Telephone Encounter (Signed)
MH please advise. Thanks   Ronnie Carey, Ronnie Carey "Ronnie Carey"  [facility] I saw Dr. Judeth Horn on the 29th, as per schedule.  Ironically, since Saturday I noticed stiffness, sweling and aching in my RIGHT calf, making it approximately 1" larger in circumference than my Left calf.  This is indicative of possible clotting or flow restriction through the venous network.  I'd like Dr. Judeth Horn to renew my Eliquis prescription for an additional 3 months, if possible, to see iof this alleviates the swelling.  Right now the swelling is not out of control, but it IS there, and it is persistent no matter how much I walk or elevate the limb.

## 2021-03-10 ENCOUNTER — Telehealth: Payer: Self-pay | Admitting: Pulmonary Disease

## 2021-03-10 DIAGNOSIS — I824Y3 Acute embolism and thrombosis of unspecified deep veins of proximal lower extremity, bilateral: Secondary | ICD-10-CM

## 2021-03-10 MED ORDER — APIXABAN 5 MG PO TABS: 5.0000 mg | ORAL_TABLET | Freq: Two times a day (BID) | ORAL | 3 refills | Status: DC

## 2021-03-10 NOTE — Telephone Encounter (Signed)
Lmtcb for pt.  

## 2021-03-10 NOTE — Telephone Encounter (Signed)
Spoke with the pt  He states not having any respiratory symptoms at all He is aware Eliquis was reordered and to get started back on that asap  STAT order for LE Doppler was placed

## 2021-03-10 NOTE — Telephone Encounter (Signed)
Attempted to reach patient. No answer. Eliquis has been re-ordered.  Staff, please place order for right lower extremity doppler to rule out DVT.  If patient calls back, patient needs to be evaluated for any respiratory symptoms. If he has shortness of breath, chest pain, dizziness, pass out or any other severe symptoms concerning for a life-threatening pulmonary embolism, he should present to the ED for evaluation. Otherwise, we can continue Eliquis and plan for imaging as noted above.  Please schedule urgent follow-up with Dr. Judeth Horn or NP after repeat Doppler completed.

## 2021-03-10 NOTE — Telephone Encounter (Signed)
Message from mychart: After my visit with you on the 29th my right leg began to ache and swell.  No amount of compression or stretching/massage/elevatiojn of the calf has alleviated the swelling.  I tried to walk today to help keep the blood moving but made it less than 1/4 mile due to the painful ache and stiffness in the calf due to the swelling.   Obviously a clot has formed in the calf or veins above it.  I've re-started the Eliquis I had left over from the last prescription, and have enough for about 4 days.  How you would advise me to go forward?  Emergency room?  resume treatment?  Primary Pulmonologist: Hunsucker Last office visit and with whom: 03/05/2021 Hunsucker What do we see them for (pulmonary problems): PE Last OV assessment/plan:  Assessment & Plan:    Pulmonary embolism: Provoked in the setting of sedentary state with sciatica and low back pain.  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  2 provoked.  Could consider lifelong coagulation.  However, he is a avid motorcyclist and worry about the risks of anticoagulation.  Has completed 3 months of anticoagulation.  Symptoms and RV function improved.   Elevated pulmonary pressures: In setting of submassive PE.  Improved symptoms of dyspnea.  RV pressures normalized on repeat TTE 11/2020.  Mild RV dilation persisted.  This was done sooner than anticipated had planned 2-month follow-up 01/2021.  We will repeat limited echocardiogram to evaluate RV, ordered today.     Return if symptoms worsen or fail to improve.     Karren Burly, MD 03/05/2021            Patient Instructions by Karren Burly, MD at 03/05/2021 11:30 AM  Author: Karren Burly, MD Author Type: Physician Filed: 03/05/2021 11:46 AM  Note Status: Addendum Cosign: Cosign Not Required Encounter Date: 03/05/2021  Editor: Karren Burly, MD (Physician)      Prior Versions: 1. Hunsucker, Lesia Sago, MD (Physician) at 03/05/2021 11:46 AM - Signed     Nice to see you   We will repeat a limited ultrasound of the right side of the heart since the prior was a bit premature. The good news is that the most recent showed signs of improvement already.    If the ultrasound is improved no need for follow up.       Orthostatic Vitals Recorded in This Encounter   03/05/2021  1133     Patient Position: Sitting  BP Location: Left Arm  Cuff Size: Normal   Instructions    Return if symptoms worsen or fail to improve.  Nice to see you   We will repeat a limited ultrasound of the right side of the heart since the prior was a bit premature. The good news is that the most recent showed signs of improvement already.    If the ultrasound is improved no need for follow up.       Reason for call: Pain in back of leg from knee down with swelling since visit on 03/05/21.  Rates pain at a 5 when walking or doing anything.  States it feels like a muscular stretching pain.  He denies any sob.  He began taking the Eliquis he had left and has enough for 4 day.  He would like recommendations on how to proceed given his history of blood clots.  Dr. Everardo All, please advise.  Thank you.  (examples of things to ask: : When did symptoms start?  Fever? Cough? Productive? Color to sputum? More sputum than usual? Wheezing? Have you needed increased oxygen? Are you taking your respiratory medications? What over the counter measures have you tried?)  No Known Allergies  Immunization History  Administered Date(s) Administered   Hepatitis A, Ped/Adol-2 Dose 11/30/2017

## 2021-03-11 ENCOUNTER — Telehealth: Payer: Self-pay | Admitting: Pulmonary Disease

## 2021-03-11 ENCOUNTER — Other Ambulatory Visit: Payer: Self-pay | Admitting: Pulmonary Disease

## 2021-03-11 ENCOUNTER — Ambulatory Visit (HOSPITAL_COMMUNITY)
Admission: RE | Admit: 2021-03-11 | Discharge: 2021-03-11 | Disposition: A | Discharge status: Home / Self Care | Payer: Medicaid Other | Source: Ambulatory Visit | Attending: Pulmonary Disease | Admitting: Pulmonary Disease

## 2021-03-11 ENCOUNTER — Other Ambulatory Visit: Payer: Self-pay

## 2021-03-11 DIAGNOSIS — H524 Presbyopia: Secondary | ICD-10-CM | POA: Diagnosis not present

## 2021-03-11 DIAGNOSIS — I824Y3 Acute embolism and thrombosis of unspecified deep veins of proximal lower extremity, bilateral: Secondary | ICD-10-CM | POA: Diagnosis not present

## 2021-03-11 DIAGNOSIS — I2692 Saddle embolus of pulmonary artery without acute cor pulmonale: Secondary | ICD-10-CM

## 2021-03-11 NOTE — Progress Notes (Signed)
Lower extremity venous has been completed.   Preliminary results in CV Proc.   Ronnie Carey Ronnie Carey 03/11/2021 3:15 PM

## 2021-03-11 NOTE — Telephone Encounter (Signed)
Called and spoke with patient. He stated that he does have the Eliquis and does not plan on stopping it. He denied any respiratory symptoms and stated that the leg pain he was having earlier this week seems to have eased off. I advised him that if he does develop any SOB or chest pain to call us. He verbalized understanding.   Nothing further needed at time of call.

## 2021-03-11 NOTE — Telephone Encounter (Signed)
No further recommendations; Continue Eliquis. Please ensure patient is not having any respiratory symptoms including shortness of breath, chest pain or hypoxia. Follow up with Dr. Judeth Horn first available

## 2021-03-11 NOTE — Telephone Encounter (Signed)
Spoke with Meagan at [facility]   Pt pos for multiple DVT on venous doppler today  Summary:  RIGHT:  - Findings consistent with age indeterminate deep vein thrombosis  involving the right femoral vein, right popliteal vein, right posterior  tibial veins, and right gastrocnemius veins.  - No cystic structure found in the popliteal fossa.     LEFT:  - No evidence of common femoral vein obstruction.     *See table(s) above for measurements and observations.     Pt was just started back on Eliquis today and was sent home  [facility], please advise

## 2021-03-12 NOTE — Telephone Encounter (Signed)
Called and spoke with pt about his mychart message and stated to pt that it would be best for him to go get evaluated. Pt verbalized understanding. Nothing further needed.

## 2021-03-16 ENCOUNTER — Telehealth: Payer: Self-pay | Admitting: Pulmonary Disease

## 2021-03-16 ENCOUNTER — Telehealth: Payer: Self-pay | Admitting: Physician Assistant

## 2021-03-16 NOTE — Telephone Encounter (Signed)
Toma Copier called and said that they are going to go ahead and see pt w/o labs just wanted to let you know pt appt is tomorrow I believe she said any question you can call her back @ [phone].Caren Griffins

## 2021-03-16 NOTE — Telephone Encounter (Signed)
Called and spoke with  Bethany at the [facility] and she stated that the pt is scheduled to see them tomorrow but they were looking for the labs.  Last labs in the computer are from May 2022.  Waiting for Bethany to call back to see if Provider will be able to see them still.  MH please advise if you want to have pt do labs today?  Thanks

## 2021-03-16 NOTE — Telephone Encounter (Signed)
Scheduled appt per 10/10 referral. Pt is aware of appt date and time.  

## 2021-03-16 NOTE — Addendum Note (Signed)
Addended byVilma Meckel on: 03/16/2021 02:39 PM   Modules accepted: Orders

## 2021-03-16 NOTE — Telephone Encounter (Signed)
Noted  

## 2021-03-16 NOTE — Progress Notes (Signed)
Discussed results with patient. 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 Continue Eliquis for now. Strongly advised he can not ride motorcycles on blood thinner. Will refer to Hematology.

## 2021-03-17 ENCOUNTER — Inpatient Hospital Stay: Payer: Medicaid Other | Attending: Physician Assistant | Admitting: Physician Assistant

## 2021-03-17 ENCOUNTER — Inpatient Hospital Stay: Payer: Medicaid Other

## 2021-03-17 ENCOUNTER — Other Ambulatory Visit: Payer: Self-pay

## 2021-03-17 ENCOUNTER — Encounter: Payer: Self-pay | Admitting: Physician Assistant

## 2021-03-17 VITALS — BP 110/64 | HR 62 | Temp 98.4°F | Resp 18 | Ht 71.0 in | Wt 250.4 lb

## 2021-03-17 DIAGNOSIS — F32A Depression, unspecified: Secondary | ICD-10-CM

## 2021-03-17 DIAGNOSIS — R600 Localized edema: Secondary | ICD-10-CM

## 2021-03-17 DIAGNOSIS — Z86718 Personal history of other venous thrombosis and embolism: Secondary | ICD-10-CM

## 2021-03-17 DIAGNOSIS — I825Z3 Chronic embolism and thrombosis of unspecified deep veins of distal lower extremity, bilateral: Secondary | ICD-10-CM

## 2021-03-17 DIAGNOSIS — I2692 Saddle embolus of pulmonary artery without acute cor pulmonale: Secondary | ICD-10-CM

## 2021-03-17 DIAGNOSIS — Z7901 Long term (current) use of anticoagulants: Secondary | ICD-10-CM

## 2021-03-17 DIAGNOSIS — M7989 Other specified soft tissue disorders: Secondary | ICD-10-CM | POA: Diagnosis not present

## 2021-03-17 DIAGNOSIS — Z86711 Personal history of pulmonary embolism: Secondary | ICD-10-CM

## 2021-03-17 DIAGNOSIS — I82411 Acute embolism and thrombosis of right femoral vein: Secondary | ICD-10-CM

## 2021-03-17 DIAGNOSIS — R0602 Shortness of breath: Secondary | ICD-10-CM

## 2021-03-17 DIAGNOSIS — I82412 Acute embolism and thrombosis of left femoral vein: Secondary | ICD-10-CM

## 2021-03-17 LAB — CMP (CANCER CENTER ONLY)
ALT: 19 U/L (ref 0–44)
AST: 15 U/L (ref 15–41)
Albumin: 4.1 g/dL (ref 3.5–5.0)
Alkaline Phosphatase: 70 U/L (ref 38–126)
Anion gap: 9 (ref 5–15)
BUN: 17 mg/dL (ref 8–23)
CO2: 24 mmol/L (ref 22–32)
Calcium: 9.4 mg/dL (ref 8.9–10.3)
Chloride: 106 mmol/L (ref 98–111)
Creatinine: 1.08 mg/dL (ref 0.61–1.24)
GFR, Estimated: >60 mL/min (ref >60)
Glucose, Bld: 125 mg/dL — ABNORMAL HIGH (ref 70–99)
Potassium: 4.1 mmol/L (ref 3.5–5.1)
Sodium: 139 mmol/L (ref 135–145)
Total Bilirubin: 0.4 mg/dL (ref 0.3–1.2)
Total Protein: 7.7 g/dL (ref 6.5–8.1)

## 2021-03-17 LAB — CBC WITH DIFFERENTIAL (CANCER CENTER ONLY)
Abs Immature Granulocytes: 0.03 10*3/uL (ref 0.00–0.07)
Basophils Absolute: 0.1 10*3/uL (ref 0.0–0.1)
Basophils Relative: 1 %
Eosinophils Absolute: 0.9 10*3/uL — ABNORMAL HIGH (ref 0.0–0.5)
Eosinophils Relative: 11 %
HCT: 39.5 % (ref 39.0–52.0)
Hemoglobin: 13.9 g/dL (ref 13.0–17.0)
Immature Granulocytes: 0 %
Lymphocytes Relative: 22 %
Lymphs Abs: 1.8 10*3/uL (ref 0.7–4.0)
MCH: 30.6 pg (ref 26.0–34.0)
MCHC: 35.2 g/dL (ref 30.0–36.0)
MCV: 87 fL (ref 80.0–100.0)
Monocytes Absolute: 0.7 10*3/uL (ref 0.1–1.0)
Monocytes Relative: 9 %
Neutro Abs: 4.7 10*3/uL (ref 1.7–7.7)
Neutrophils Relative %: 57 %
Platelet Count: 328 10*3/uL (ref 150–400)
RBC: 4.54 MIL/uL (ref 4.22–5.81)
RDW: 12.5 % (ref 11.5–15.5)
WBC Count: 8.1 10*3/uL (ref 4.0–10.5)
nRBC: 0 % (ref 0.0–0.2)

## 2021-03-17 NOTE — Progress Notes (Signed)
[facility] Telephone:([phone]) [phone]   [phone]  INITIAL CONSULT NOTE  Patient Care Team: Rema Fendt, NP as PCP - General (Nurse Practitioner)  Hematological/Oncological History 1) 10/01/2010: After knee replacement, patient developed lower extremity swelling and chest pain.  -Doppler US: Extensive left lower extremity DVT involving the inferior common femoral vein and popliteal vein.  -CT chest: acute pulmonary embolism involving a branch of the right lower lobe pulmonary artery.  -Completed course of anticoagulation with improvement of symptoms.   2) 10/13/2020-10/16/2020: Admitted for acute onset shortness of breath and substernal chest pain.  -CTA chest: Significant bilateral pulmonary emboli with evidence of saddle component centrally. Elevated RV/LV ratio is noted of 3.Patchy airspace opacity in the medial aspect of the left upper lobe which may represent edema or early infiltrate. -Doppler US:Findings consistent with acute deep vein thrombosis involving the right femoral vein, right popliteal vein,right posterior tibial veins, right peroneal veins, and right gastrocnemius veins. DVT in the popliteal vein is mobile. Findings consistent with age indeterminate deep vein thrombosis involving the left common femoral vein, SF junction, left femoral vein, left proximal profunda vein, left popliteal vein, left posterior tibial veins, and left gastrocnemius veins. -Echo: Moderately reduced right ventricular function secondary to PE -Initially treated with heparin infusion and then transitioned to Eliquis.  3) 03/17/2021: Establish care with Georga Kaufmann PA-C    CHIEF COMPLAINTS/PURPOSE OF CONSULTATION:  "Recurrent DVT and PE "  HISTORY OF PRESENTING ILLNESS:  Ronnie Carey 65 y.o. male with medical history significant for PE and DVT, small bowel obstruction and arthritis. Patient is unaccompanied for this visit. He reports that since initiating Eliquis, his  shortness of breath and chest pain resolved. He continues to have lower extremity edema, predominantly on the right leg.   On exam today, Ronnie Carey reports energy levels are fairly stable. He would like to be more active. He attributes his sedentary lifestyle due to arthritic pain and some depression. He is an avid gamer and admits to sitting on the couch for hours at time. He does get up to walk around the house or use the restroom periodically throughout the day. He has a good appetite and denies any dietary changes. He denies any nausea, vomiting or abdominal pain. His bowel movements are unchanged. He denies easy bruising or signs of bleeding. He denies any fevers, chills, or night sweats. He has no other complaints. Rest of the 10 point ROS is below.   MEDICAL HISTORY:  Past Medical History:  Diagnosis Date   History of pulmonary embolus (PE)    SBO (small bowel obstruction) (HCC) 05/2018    SURGICAL HISTORY: Past Surgical History:  Procedure Laterality Date   TONSILLECTOMY     TOTAL KNEE ARTHROPLASTY Right 2012    SOCIAL HISTORY: Social History   Socioeconomic History   Marital status: Single    Spouse name: Not on file   Number of children: Not on file   Years of education: Not on file   Highest education level: Not on file  Occupational History   Not on file  Tobacco Use   Smoking status: Never   Smokeless tobacco: Never  Vaping Use   Vaping Use: Never used  Substance and Sexual Activity   Alcohol use: Never   Drug use: Never   Sexual activity: Not Currently  Other Topics Concern   Not on file  Social History Narrative   Not on file   Social Determinants of Health   Financial Resource Strain:  Not on file  Food Insecurity: Not on file  Transportation Needs: Not on file  Physical Activity: Not on file  Stress: Not on file  Social Connections: Not on file  Intimate Partner Violence: Not on file    FAMILY HISTORY: Family History  Problem Relation Age of  Onset   Stroke Mother        At age of 49, history of smoking    ALLERGIES:  has No Known Allergies.  MEDICATIONS:  Current Outpatient Medications  Medication Sig Dispense Refill   apixaban (ELIQUIS) 5 MG TABS tablet Take 1 tablet (5 mg total) by mouth 2 (two) times daily. 60 tablet 3   omeprazole (PRILOSEC) 20 MG capsule Take 1 capsule (20 mg total) by mouth daily. To reduce stomach acid 90 capsule 0   APIXABAN (ELIQUIS) VTE STARTER PACK (10MG  AND 5MG ) Take as directed on package: start with two-5mg  tablets twice daily for 7 days. On day 8, switch to one-5mg  tablet twice daily. 1 tablet 0   No current facility-administered medications for this visit.    REVIEW OF SYSTEMS:   Constitutional: ( - ) fevers, ( - )  chills , ( - ) night sweats Eyes: ( - ) blurriness of vision, ( - ) double vision, ( - ) watery eyes Ears, nose, mouth, throat, and face: ( - ) mucositis, ( - ) sore throat Respiratory: ( - ) cough, ( - ) dyspnea, ( - ) wheezes Cardiovascular: ( - ) palpitation, ( - ) chest discomfort, ( + ) lower extremity swelling Gastrointestinal:  ( - ) nausea, ( - ) heartburn, ( - ) change in bowel habits Skin: ( - ) abnormal skin rashes Lymphatics: ( - ) new lymphadenopathy, ( - ) easy bruising Neurological: ( - ) numbness, ( - ) tingling, ( - ) new weaknesses Behavioral/Psych: ( - ) mood change, ( - ) new changes  All other systems were reviewed with the patient and are negative.  PHYSICAL EXAMINATION: ECOG PERFORMANCE STATUS: 1 - Symptomatic but completely ambulatory  Vitals:   03/17/21 1129  BP: 110/64  Pulse: 62  Resp: 18  Temp: 98.4 F (36.9 C)  SpO2: 96%   Filed Weights   03/17/21 1129  Weight: 250 lb 6.4 oz (113.6 kg)    GENERAL: well appearing male in NAD  SKIN: skin color, texture, turgor are normal, no rashes or significant lesions EYES: conjunctiva are pink and non-injected, sclera clear OROPHARYNX: no exudate, no erythema; lips, buccal mucosa, and tongue  normal  NECK: supple, non-tender LYMPH:  no palpable lymphadenopathy in the cervical, axillary or supraclavicular lymph nodes.  LUNGS: clear to auscultation and percussion with normal breathing effort HEART: regular rate & rhythm and no murmurs. Right lower extremity ankle edema and varicosity.  ABDOMEN: soft, non-tender, non-distended, normal bowel sounds Musculoskeletal: no cyanosis of digits and no clubbing  PSYCH: alert & oriented x 3, fluent speech NEURO: no focal motor/sensory deficits  LABORATORY DATA:  I have reviewed the data as listed CBC Latest Ref Rng & Units 03/17/2021 10/24/2020 10/16/2020  WBC 4.0 - 10.5 K/uL 8.1 6.3 9.0  Hemoglobin 13.0 - 17.0 g/dL 10/26/2020 12/16/2020 19.3  Hematocrit 39.0 - 52.0 % 39.5 43.7 40.8  Platelets 150 - 400 K/uL 328 358 208    CMP Latest Ref Rng & Units 10/24/2020 10/15/2020 10/14/2020  Glucose 65 - 99 mg/dL 12/15/2020) 81 12/14/2020)  BUN 8 - 27 mg/dL 11 14 13   Creatinine 0.76 - 1.27 mg/dL 973(Z 329(J  1.20  Sodium 134 - 144 mmol/L 141 137 137  Potassium 3.5 - 5.2 mmol/L 4.5 4.2 4.0  Chloride 96 - 106 mmol/L 99 104 103  CO2 20 - 29 mmol/L 20 25 22   Calcium 8.6 - 10.2 mg/dL 9.4 8.9 9.1  Total Protein 6.5 - 8.1 g/dL - - -  Total Bilirubin 0.3 - 1.2 mg/dL - - -  Alkaline Phos 38 - 126 U/L - - -  AST 15 - 41 U/L - - -  ALT 0 - 44 U/L - - -    RADIOGRAPHIC STUDIES: I have personally reviewed the radiological images as listed and agreed with the findings in the report. VAS LOWER EXTREMITY VENOUS (DVT)  Result Date: 03/11/2021  Lower Venous DVT Study Patient Name:  Ronnie Carey  Date of Exam:   03/11/2021 Medical Rec #: [id number]       Accession #:    [id number] Date of Birth: Sep 18, 1955       Patient Gender: M Patient Age:   24 years Exam Location:  [facility] Procedure:      VAS [facility] LOWER EXTREMITY VENOUS (DVT) Referring Phys: CHI ELLISON --------------------------------------------------------------------------------  Indications: Pain.  Comparison Study:  11/06/20 prior Performing Technologist: 01/06/21 RVS  Examination Guidelines: A complete evaluation includes B-mode imaging, spectral Doppler, color Doppler, and power Doppler as needed of all accessible portions of each vessel. Bilateral testing is considered an integral part of a complete examination. Limited examinations for reoccurring indications may be performed as noted. The reflux portion of the exam is performed with the patient in reverse Trendelenburg.  +---------+---------------+---------+-----------+----------+-------------------+ RIGHT    CompressibilityPhasicitySpontaneityPropertiesThrombus Aging      +---------+---------------+---------+-----------+----------+-------------------+ CFV      Full           Yes      Yes                                      +---------+---------------+---------+-----------+----------+-------------------+ SFJ      Full                                                             +---------+---------------+---------+-----------+----------+-------------------+ FV Prox  Full                                                             +---------+---------------+---------+-----------+----------+-------------------+ FV Mid   Full                                                             +---------+---------------+---------+-----------+----------+-------------------+ FV DistalNone                                                             +---------+---------------+---------+-----------+----------+-------------------+  PFV      Full                                         Age Indeterminate   +---------+---------------+---------+-----------+----------+-------------------+ POP      None           No       No                   Age Indeterminate   +---------+---------------+---------+-----------+----------+-------------------+ PTV      None                                         Age Indeterminate    +---------+---------------+---------+-----------+----------+-------------------+ PERO                                                  Not well visualized +---------+---------------+---------+-----------+----------+-------------------+ Gastroc  None                                         Age Indeterminate   +---------+---------------+---------+-----------+----------+-------------------+ SSV      Partial                                      Age Indeterminate   +---------+---------------+---------+-----------+----------+-------------------+   +----+---------------+---------+-----------+----------+--------------+ LEFTCompressibilityPhasicitySpontaneityPropertiesThrombus Aging +----+---------------+---------+-----------+----------+--------------+ CFV Full           Yes      Yes                                 +----+---------------+---------+-----------+----------+--------------+     Summary: RIGHT: - Findings consistent with age indeterminate deep vein thrombosis involving the right femoral vein, right popliteal vein, right posterior tibial veins, and right gastrocnemius veins. - Findings consistent with age indeterminate superficial vein thrombosis involving the right small saphenous vein. - No cystic structure found in the popliteal fossa.  LEFT: - No evidence of common femoral vein obstruction.  *See table(s) above for measurements and observations. Electronically signed by Coral Else MD on 03/11/2021 at 4:52:27 PM.    Final     ASSESSMENT & PLAN Ronnie Carey is a 65 y.o. 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 Most recently, patient was diagnosed with saddle PE and bilateral lower leg DVT in May 2022. Patient is currently on Eliquis 5 mg BID with good tolerance.   I discussed risks versus benefits of anticoagulation in the setting of recurrent VTEs and no definite provoking factor that caused the most recent episode. Patient notes that prior  to his recent episode he was sedentary but would take breaks to ambulate. Recommend indefinite anticoagulation with Eliquis. After six months, patient is eligible for maintenance dose at 2.5 mg BID. Patient will proceed with laboratory evaluation with CBC, CMP, beta-2 glycoprotein antibodies, anticardiolipin antibodies and lupus anticoagulant.   #Recurrent PE and DVT: --First episode was in April 2012 following knee replacement.  --Second and most recent  episode in May 2022, no definitive provoking factor.  --Currently on Eliquis 5 mg BID. Recommend indefinite anticoagulation due to recurrent VTEs and most recent episode that involved a saddle embolism.  --Labs today to check CBC, CMP, beta-2 glycoprotein antibodies, anticardiolipin antibodies and lupus anticoagulant.  --RTC in 6 months with repeat labs.   Orders Placed This Encounter  Procedures   CBC with Differential (Cancer Center Only)    Standing Status:   Future    Number of Occurrences:   1    Standing Expiration Date:   03/17/2022   CMP (Cancer Center only)    Standing Status:   Future    Number of Occurrences:   1    Standing Expiration Date:   03/17/2022   Beta-2-glycoprotein i abs, IgG/M/A    Standing Status:   Future    Number of Occurrences:   1    Standing Expiration Date:   03/17/2022   Cardiolipin antibodies, IgG, IgM, IgA*    Standing Status:   Future    Number of Occurrences:   1    Standing Expiration Date:   03/17/2022   Lupus anticoagulant panel*    Standing Status:   Future    Number of Occurrences:   1    Standing Expiration Date:   03/17/2022    All questions were answered. The patient knows to call the clinic with any problems, questions or concerns.  I have spent a total of 60 minutes minutes of face-to-face and non-face-to-face time, preparing to see the patient, obtaining and/or reviewing separately obtained history, performing a medically appropriate examination, counseling and educating the patient,  ordering tests, documenting clinical information in the electronic health record,  and care coordination.   Georga Kaufmann, PA-C Department of Hematology/[facility]cology [facility] Phone: [phone]  Patient was seen with Dr. Leonides Schanz.   I have read the above note and personally examined the patient. I agree with the assessment and plan as noted above.  Briefly Ronnie Carey is a 65 year old male who presents for evaluation of recurrent VTE's.  The patient initially had a provoked lower extremity DVT after knee surgery.  Unfortunately more recently the patient developed a saddle pulmonary embolus and lower extremity DVT without a clear cause.  Given the dangerous nature of a saddle pulmonary embolus and the lack of a clear etiology for these findings I would recommend that we continue indefinite anticoagulation on this patient.  He is currently on Eliquis therapy 5 mg twice daily.  I recommend we continue this for at least 6 months duration after which time we can reevaluate and determine if he would be a good candidate for maintenance therapy with 2.5 mg Eliquis twice daily.  Patient voices understanding of this plan moving forward.   Ulysees Barns, MD Department of Hematology/Oncology [facility] Phone: [phone] Pager: [phone] Email: [email or web address]

## 2021-03-18 LAB — BETA-2-GLYCOPROTEIN I ABS, IGG/M/A
Beta-2 Glyco I IgG: <9 GPI IgG units (ref 0–20)
Beta-2-Glycoprotein I IgA: <9 GPI IgA units (ref 0–25)
Beta-2-Glycoprotein I IgM: <9 GPI IgM units (ref 0–32)

## 2021-03-18 LAB — LUPUS ANTICOAGULANT PANEL
DRVVT: 47.5 s — ABNORMAL HIGH (ref 0.0–47.0)
PTT Lupus Anticoagulant: 34 s (ref 0.0–51.9)

## 2021-03-18 LAB — CARDIOLIPIN ANTIBODIES, IGG, IGM, IGA
Anticardiolipin IgA: <9 APL U/mL (ref 0–11)
Anticardiolipin IgG: <9 GPL U/mL (ref 0–14)
Anticardiolipin IgM: <9 MPL U/mL (ref 0–12)

## 2021-03-18 LAB — DRVVT MIX: dRVVT Mix: 44.5 s — ABNORMAL HIGH (ref 0.0–40.4)

## 2021-03-18 LAB — DRVVT CONFIRM: dRVVT Confirm: 1.1 ratio (ref 0.8–1.2)

## 2021-03-20 ENCOUNTER — Telehealth: Payer: Self-pay | Admitting: Physician Assistant

## 2021-03-20 NOTE — Telephone Encounter (Signed)
I called [name] to review the lab results from 03/17/2021.  CBC and CMP were reviewed without any intervention needed.  There was no evidence of antiphospholipid syndrome.  Recommendation is to continue with Eliquis 5 mg twice daily.  Patient will return to the clinic in 6 months for a follow-up.  Patient expressed understanding and satisfaction with the plan provided.

## 2021-03-27 ENCOUNTER — Ambulatory Visit (HOSPITAL_COMMUNITY): Payer: Medicaid Other | Attending: Cardiology

## 2021-03-27 ENCOUNTER — Other Ambulatory Visit: Payer: Self-pay

## 2021-03-27 DIAGNOSIS — I2699 Other pulmonary embolism without acute cor pulmonale: Secondary | ICD-10-CM | POA: Insufficient documentation

## 2021-03-27 LAB — ECHOCARDIOGRAM LIMITED
Area-P 1/2: 2.56 cm2
S' Lateral: 3.5 cm

## 2021-05-25 ENCOUNTER — Telehealth: Payer: Self-pay | Admitting: Pulmonary Disease

## 2021-05-26 NOTE — Telephone Encounter (Signed)
LMTCB

## 2021-05-28 NOTE — Telephone Encounter (Signed)
Called and spoke to pt. Pt states he was able to get his refill for Eliquis and is still taking it. Per phone note from 03/11/21 pt was suppose to have a first available appt with Dr. Judeth Horn, this does not look like it was scheduled. Appt made with Dr. Marylu Lund for 12/29 at 1515. Pt verbalized understanding and denied any further questions or concerns at this time.

## 2021-06-04 ENCOUNTER — Ambulatory Visit (INDEPENDENT_AMBULATORY_CARE_PROVIDER_SITE_OTHER): Payer: Medicaid Other | Admitting: Pulmonary Disease

## 2021-06-04 ENCOUNTER — Other Ambulatory Visit: Payer: Self-pay

## 2021-06-04 ENCOUNTER — Encounter: Payer: Self-pay | Admitting: Pulmonary Disease

## 2021-06-04 VITALS — BP 118/76 | HR 75 | Temp 98.2°F | Ht 71.0 in | Wt 248.4 lb

## 2021-06-04 DIAGNOSIS — R202 Paresthesia of skin: Secondary | ICD-10-CM | POA: Diagnosis not present

## 2021-06-04 LAB — HEMOGLOBIN A1C: Hgb A1c MFr Bld: 9.8 % — ABNORMAL HIGH (ref 4.6–6.5)

## 2021-06-04 NOTE — Progress Notes (Signed)
@Patient  ID: , male    DOB: [birth year], 65 y.o.   MRN: [id number]  Chief Complaint  Patient presents with   Follow-up    Follow up for PE. No issues noted with breathing from patient at this time     Referring provider: [id number], NP  HPI:   65 y.o. whom we are seeing in in follow-up of submassive PE and recurrent VTE.  Overall doing well.  No breathing complaints. Seen by hematology since  last visit. Plan indefinite anticoagulation. Complains of paraesthesias sole of right foot, same side as chronic VTE.  HPI at initial visit: Patient reports an 2012 shortly after knee replacement developed a lower extremity swelling and chest pain.  Took several days for evaluation but eventually diagnosed with acute left DVT as well as PE.  Completed course of anticoagulation with improvement.  Since then has had chronic half to three-quarter inch swelling compared to right lower extremity of the left lower extremity.  He was less active during the winter due to the cold.  When tried to become more active during the spring had bad bout of sciatica, low back pain.  Is present for several weeks.  Would spend majority of time up to 14 hours a day sitting in a chair without moving.  He developed symptoms of lower extremity swelling and similar symptoms to PE in the past.  Presented to ED.  CTA on my interpretation revealed left upper lobe infiltrate and large bilateral clot burden in pulmonary arteries.  Lower extremity Doppler at that time that this revealed right DVT that is acute as well as a age-indeterminate left DVT.  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  He was started on heparin.  TTE revealed RV dysfunction..  Symptoms are markedly better.  He is back to exercising.  Trying to lose weight.  Being followed by primary care doctor.  Most recent PCP note reviewed.  Denies any issues that hold him back from exercising.   Questionaires  / Pulmonary Flowsheets:   ACT:  No flowsheet data found.  MMRC: mMRC Dyspnea Scale mMRC Score  11/05/2020 0    Epworth:  No flowsheet data found.  Tests:   FENO:  No results found for: NITRICOXIDE  PFT: No flowsheet data found.  WALK:  No flowsheet data found.  Imaging: Personally reviewed as per EMR discussion this note No results found.   Lab Results: Personally reviewed and as per EMR CBC    Component Value Date/Time   WBC 8.1 03/17/2021 1225   WBC 9.0 10/16/2020 0034   RBC 4.54 03/17/2021 1225   HGB 13.9 03/17/2021 1225   HGB 14.1 10/24/2020 1151   HCT 39.5 03/17/2021 1225   HCT 43.7 10/24/2020 1151   PLT 328 03/17/2021 1225   PLT 358 10/24/2020 1151   MCV 87.0 03/17/2021 1225   MCV 90 10/24/2020 1151   MCH 30.6 03/17/2021 1225   MCHC 35.2 03/17/2021 1225   RDW 12.5 03/17/2021 1225   RDW 12.7 10/24/2020 1151   LYMPHSABS 1.8 03/17/2021 1225   LYMPHSABS 2.2 12/04/2018 1532   MONOABS 0.7 03/17/2021 1225   EOSABS 0.9 (H) 03/17/2021 1225   EOSABS 0.4 12/04/2018 1532   BASOSABS 0.1 03/17/2021 1225   BASOSABS 0.1 12/04/2018 1532    BMET    Component Value Date/Time   NA 139 03/17/2021 1225   NA 141 10/24/2020 1151   K 4.1 03/17/2021 1225   CL  106 03/17/2021 1225   CO2 24 03/17/2021 1225   GLUCOSE 125 (H) 03/17/2021 1225   BUN 17 03/17/2021 1225   BUN 11 10/24/2020 1151   CREATININE 1.08 03/17/2021 1225   CALCIUM 9.4 03/17/2021 1225   GFRNONAA >60 03/17/2021 1225   GFRAA >60 05/10/2018 0537    BNP    Component Value Date/Time   BNP 216.1 (H) 10/13/2020 1847    ProBNP No results found for: PROBNP  Specialty Problems   None  No Known Allergies  Immunization History  Administered Date(s) Administered   Hepatitis A, Ped/Adol-2 Dose 11/30/2017    Past Medical History:  Diagnosis Date   History of pulmonary embolus (PE)    SBO (small bowel obstruction) (HCC) 05/2018    Tobacco History: Social History   Tobacco Use  Smoking  Status Never  Smokeless Tobacco Never   Counseling given: Not Answered   Continue to not smoke  Outpatient Encounter Medications as of 06/04/2021  Medication Sig   apixaban (ELIQUIS) 5 MG TABS tablet Take 1 tablet (5 mg total) by mouth 2 (two) times daily.   omeprazole (PRILOSEC) 20 MG capsule Take 1 capsule (20 mg total) by mouth daily. To reduce stomach acid   [DISCONTINUED] APIXABAN (ELIQUIS) VTE STARTER PACK (10MG  AND 5MG ) Take as directed on package: start with two-5mg  tablets twice daily for 7 days. On day 8, switch to one-5mg  tablet twice daily.   No facility-administered encounter medications on file as of 06/04/2021.     Review of Systems  Review of Systems  N/a Physical Exam  BP 118/76 (BP Location: Left Arm, Patient Position: Sitting, Cuff Size: Normal)    Pulse 75    Temp 98.2 F (36.8 C) (Oral)    Ht 5\' 11"  (1.803 m)    Wt 248 lb 6.4 oz (112.7 kg)    SpO2 97%    BMI 34.64 kg/m   Wt Readings from Last 5 Encounters:  06/04/21 248 lb 6.4 oz (112.7 kg)  03/17/21 250 lb 6.4 oz (113.6 kg)  03/05/21 248 lb 9.6 oz (112.8 kg)  11/05/20 255 lb 12.8 oz (116 kg)  10/24/20 249 lb 9.6 oz (113.2 kg)    BMI Readings from Last 5 Encounters:  06/04/21 34.64 kg/m  03/17/21 34.92 kg/m  03/05/21 34.67 kg/m  11/05/20 35.68 kg/m  10/24/20 34.81 kg/m     Physical Exam General: Well-appearing, no distress Eyes: EOMI, icterus Neck: Supple no JVP Neuro: Normal gait, no weakness Psych: Normal mood, full affect    Assessment & Plan:   Pulmonary embolism: Provoked in the setting of sedentary state with sciatica and low back pain.  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  2 provoked.  Recommend lifelong coagulation.  However, he is a avid motorcyclist and worry about the risks of anticoagulation.   Symptoms and RV function improved. AC managed by hematology.  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 Check Hgb A1c.   Return if symptoms worsen or fail  to improve.   01/05/21, MD 06/04/2021

## 2021-06-04 NOTE — Progress Notes (Signed)
Offer patient appointment for diabetes.

## 2021-06-04 NOTE — Progress Notes (Signed)
Patient complained of paresthesias. I checked an A1c. It is elevated. Can you arrange follow up for diabetes? Thanks!

## 2021-06-04 NOTE — Patient Instructions (Signed)
Nice to see you again  We will check hemoglobin A1c today  Follow up as needed

## 2021-06-09 NOTE — Progress Notes (Signed)
Virtual Visit via Telephone Note  I connected with Ronnie Carey, on 06/15/2021 at 3:01 PM by telephone due to the COVID-19 pandemic and verified that I am speaking with the correct person using two identifiers.  Due to current restrictions/limitations of in-office visits due to the COVID-19 pandemic, this scheduled clinical appointment was converted to a telehealth visit.   Consent: I discussed the limitations, risks, security and privacy concerns of performing an evaluation and management service by telephone and the availability of in person appointments. I also discussed with the patient that there may be a patient responsible charge related to this service. The patient expressed understanding and agreed to proceed.   Location of Patient: Home  Location of Provider: [facility] participating in Telemedicine visit: Ronnie [place], NP Ronnie Carey, CMA   History of Present Illness: Ronnie Carey is a 66 y.o. male who presents for diabetes.   His concerns today include:   DIABETES TYPE 2: Reports since November 2022 him and his bother into baked goods contest. Since then eating more cakes, fatty and rich foods. Usually has a keto diet focused on meat, eggs, fish, cheese, and few slices of bread. Doesn't eat much pasta or rice. He is not exercising. Not ready to begin diabetic medication stating "Anytime I begin medications I have a bad reaction." Prefers to improve diet and recheck diabetes before considering medications.  Last A1C:   Results for orders placed or performed in visit on 06/04/21  HgB A1c  Result Value Ref Range   Hgb A1c MFr Bld 9.8 (H) 4.6 - 6.5 %    2. CHIROPRACTIC REFERRAL: Requesting referral to Chiropractic. Reports history of midthoracic, low back, and cervical neck pain. Reports range of motion while turning head to the right is 60 - 70%.  3. SCABIES: Reports ongoing for 7 years. Currently  located in right elbow. Found a dead on skin a few weeks ago with magnifying glass. Tried to drill hole to cut them out but was too painful. Permethrin not helping.   4. RASH: Located at right ankle. Has improved since began months ago. Endorses itching. Requesting refills of Triamcinolone.  5. URINARY SYMPTOMS: Reports burning sensation with urination this morning.  Did not occur after first void. Denies blood in urine, scrotal tenderness, nausea, vomiting, and additional red flag symptoms.    Past Medical History:  Diagnosis Date   History of pulmonary embolus (PE)    SBO (small bowel obstruction) ([place]) 05/2018   No Known Allergies  Current Outpatient Medications on File Prior to Visit  Medication Sig Dispense Refill   apixaban (ELIQUIS) 5 MG TABS tablet Take 1 tablet (5 mg total) by mouth 2 (two) times daily. 60 tablet 3   No current facility-administered medications on file prior to visit.    Observations/Objective: Alert and oriented x 3. Not in acute distress. Physical examination not completed as this is a telemedicine visit.  Assessment and Plan: 1. Type 2 diabetes mellitus without complication, without long-term current use of insulin ([place]): - Hemoglobin A1c 9.8% on 06/04/2021, goal < 7%.  - Patient declined pharmacological therapy.  - Discussed the importance of healthy eating habits, low-carbohydrate diet, low-sugar diet, regular aerobic exercise (at least 150 minutes a week as tolerated) and medication compliance to achieve or maintain control of diabetes. - Follow-up with primary provider in 4 weeks or sooner if needed, will repeat hemoglobin A1c at that time.   2. Chronic back pain,  unspecified back location, unspecified back pain laterality: 3. Chronic neck pain: - Per patient preference referral to Chiropractic for further evaluation and management.  - Ambulatory referral to Chiropractic  4. History of scabies: - Referral to Dermatology for further evaluation  and management.  - Ambulatory referral to Dermatology  5. Rash and nonspecific skin eruption: - Continue Triamcinolone ointment as prescribed.  - Referral to Dermatology for further evaluation and management.  - Ambulatory referral to Dermatology - triamcinolone ointment (KENALOG) 0.5 %; Apply 1 application topically 2 (two) times daily.  Dispense: 80 g; Refill: 0  6. Lower urinary tract symptoms: - Patient plans to come in to the office within the next 7 days for point-of-care urinalysis.  - POCT URINALYSIS DIP (CLINITEK); Future   Follow Up Instructions: Follow-up with primary provider in 4 weeks or sooner if needed.    Patient was given clear instructions to go to Emergency Department or return to medical center if symptoms don't improve, worsen, or new problems develop.The patient verbalized understanding.  I discussed the assessment and treatment plan with the patient. The patient was provided an opportunity to ask questions and all were answered. The patient agreed with the plan and demonstrated an understanding of the instructions.   The patient was advised to call back or seek an in-person evaluation if the symptoms worsen or if the condition fails to improve as anticipated.    I provided 16 minutes total of non-face-to-face time during this encounter.   Camillia Herter, NP  [facility] [place], [place] 06/15/2021, 3:01 PM

## 2021-06-15 ENCOUNTER — Other Ambulatory Visit: Payer: Self-pay

## 2021-06-15 ENCOUNTER — Ambulatory Visit (INDEPENDENT_AMBULATORY_CARE_PROVIDER_SITE_OTHER): Payer: Medicaid Other | Admitting: Family

## 2021-06-15 DIAGNOSIS — E119 Type 2 diabetes mellitus without complications: Secondary | ICD-10-CM

## 2021-06-15 DIAGNOSIS — M542 Cervicalgia: Secondary | ICD-10-CM

## 2021-06-15 DIAGNOSIS — G8929 Other chronic pain: Secondary | ICD-10-CM | POA: Diagnosis not present

## 2021-06-15 DIAGNOSIS — R21 Rash and other nonspecific skin eruption: Secondary | ICD-10-CM | POA: Diagnosis not present

## 2021-06-15 DIAGNOSIS — Z8619 Personal history of other infectious and parasitic diseases: Secondary | ICD-10-CM

## 2021-06-15 DIAGNOSIS — R399 Unspecified symptoms and signs involving the genitourinary system: Secondary | ICD-10-CM | POA: Diagnosis not present

## 2021-06-15 DIAGNOSIS — M549 Dorsalgia, unspecified: Secondary | ICD-10-CM | POA: Diagnosis not present

## 2021-06-15 MED ORDER — TRIAMCINOLONE ACETONIDE 0.5 % EX OINT: 1.0000 "application " | TOPICAL_OINTMENT | Freq: Two times a day (BID) | CUTANEOUS | 0 refills | Status: AC

## 2021-06-15 NOTE — Progress Notes (Signed)
Pt presents for telemedicine visit for diabetes follow, pt also needs refill on triamcinolone cream and he needs something for scabies stated that he has one in his right elbow for months, states that the Permethrin cream he was prescribed is not strong enough

## 2021-06-23 ENCOUNTER — Ambulatory Visit: Payer: Medicaid Other | Admitting: Dermatology

## 2021-07-22 ENCOUNTER — Ambulatory Visit (INDEPENDENT_AMBULATORY_CARE_PROVIDER_SITE_OTHER): Payer: Medicare HMO | Admitting: Family

## 2021-07-22 ENCOUNTER — Encounter: Payer: Self-pay | Admitting: Family

## 2021-07-22 ENCOUNTER — Other Ambulatory Visit: Payer: Self-pay

## 2021-07-22 VITALS — BP 122/81 | HR 74 | Temp 98.5°F | Resp 18 | Ht 70.98 in | Wt 240.0 lb

## 2021-07-22 DIAGNOSIS — E119 Type 2 diabetes mellitus without complications: Secondary | ICD-10-CM | POA: Diagnosis not present

## 2021-07-22 DIAGNOSIS — M159 Polyosteoarthritis, unspecified: Secondary | ICD-10-CM | POA: Insufficient documentation

## 2021-07-22 DIAGNOSIS — H9202 Otalgia, left ear: Secondary | ICD-10-CM

## 2021-07-22 DIAGNOSIS — M545 Low back pain, unspecified: Secondary | ICD-10-CM | POA: Insufficient documentation

## 2021-07-22 DIAGNOSIS — M2550 Pain in unspecified joint: Secondary | ICD-10-CM | POA: Insufficient documentation

## 2021-07-22 DIAGNOSIS — E669 Obesity, unspecified: Secondary | ICD-10-CM | POA: Insufficient documentation

## 2021-07-22 DIAGNOSIS — E785 Hyperlipidemia, unspecified: Secondary | ICD-10-CM | POA: Insufficient documentation

## 2021-07-22 MED ORDER — METFORMIN HCL 500 MG PO TABS: 500.0000 mg | ORAL_TABLET | Freq: Two times a day (BID) | ORAL | 0 refills | Status: DC

## 2021-07-22 MED ORDER — TRUE METRIX BLOOD GLUCOSE TEST VI STRP: ORAL_STRIP | 12 refills | Status: DC

## 2021-07-22 MED ORDER — TRUE METRIX METER W/DEVICE KIT: PACK | 0 refills | Status: DC

## 2021-07-22 MED ORDER — TRUEPLUS LANCETS 28G MISC: 4 refills | Status: DC

## 2021-07-22 NOTE — Progress Notes (Signed)
Pt presents for diabetes follow-up 9.8% on 06/01/21 up to 10.3% today  Request referral to ENT for left ear issues

## 2021-07-22 NOTE — Progress Notes (Signed)
Patient ID: Ronnie Carey, male    DOB: 1956-03-17  MRN: [id number]  CC: Diabetes Follow-Up  Subjective: Ronnie Carey is a 66 y.o. male who presents for diabetes follow-up.   His concerns today include:  DIABETES TYPE 2 FOLLOW-UP: 06/15/2021: - Hemoglobin A1c 9.8% on 06/04/2021, goal < 7%.  - Patient declined pharmacological therapy.   07/22/2021: Has improved diet. Eating more protein, eggs, salad. Has bread occasionally. Drinking water. Recently began exercise regimen. Not checking blood sugars at home. Having some intermittent nerve pain in left heel. Noticed one day while walking on the treadmill. Denies swelling, numbness, and additional red flag symptoms. Has concerns that A1c is increasing even though he is monitoring food intake and exercising. Wonders if he has pancreas issues or insulin resistance.   2. LEFT EAR DISCOMFORT: Requesting referral to ENT.    Patient Active Problem List   Diagnosis Date Noted   Dyslipidemia (high LDL; low HDL) 07/22/2021   Hyperlipidemia, unspecified 07/22/2021   Joint pain 07/22/2021   Low back pain 07/22/2021   Obesity 07/22/2021   Osteoarthritis of multiple joints 07/22/2021   Acute saddle pulmonary embolism (HCC) 10/13/2020   Patellar clunk syndrome of right knee 02/19/2019   Gastro-esophageal reflux disease without esophagitis 02/02/2019   History of pulmonary embolism 02/02/2019   Osteoarthritis of left knee 02/02/2019   Personal history of DVT (deep vein thrombosis) 01/10/2019   Unilateral primary osteoarthritis, left knee 08/29/2018   Incarcerated umbilical hernia 08/24/2018   Osteoarthritis 06/07/2008     Current Outpatient Medications on File Prior to Visit  Medication Sig Dispense Refill   apixaban (ELIQUIS) 5 MG TABS tablet Take 1 tablet (5 mg total) by mouth 2 (two) times daily. 60 tablet 3   ibuprofen (ADVIL) 800 MG tablet      omeprazole (PRILOSEC) 20 MG capsule      triamcinolone ointment (KENALOG) 0.5 % Apply 1  application topically 2 (two) times daily. 80 g 0   No current facility-administered medications on file prior to visit.    No Known Allergies  Social History   Socioeconomic History   Marital status: Single    Spouse name: Not on file   Number of children: Not on file   Years of education: Not on file   Highest education level: Not on file  Occupational History   Not on file  Tobacco Use   Smoking status: Never   Smokeless tobacco: Never  Vaping Use   Vaping Use: Never used  Substance and Sexual Activity   Alcohol use: Never   Drug use: Never   Sexual activity: Not Currently  Other Topics Concern   Not on file  Social History Narrative   Not on file   Social Determinants of Health   Financial Resource Strain: Not on file  Food Insecurity: Not on file  Transportation Needs: Not on file  Physical Activity: Not on file  Stress: Not on file  Social Connections: Not on file  Intimate Partner Violence: Not on file    Family History  Problem Relation Age of Onset   Stroke Mother        At age of 33, history of smoking    Past Surgical History:  Procedure Laterality Date   TONSILLECTOMY     TOTAL KNEE ARTHROPLASTY Right 2012    ROS: Review of Systems Negative except as stated above  PHYSICAL EXAM: BP 122/81 (BP Location: Left Arm, Patient Position: Sitting, Cuff Size: Large)    Pulse 74  Temp 98.5 F (36.9 C)    Resp 18    Ht 5' 10.98" (1.803 m)    Wt 240 lb (108.9 kg)    SpO2 95%    BMI 33.49 kg/m   Physical Exam HENT:     Head: Normocephalic and atraumatic.  Eyes:     Extraocular Movements: Extraocular movements intact.     Conjunctiva/sclera: Conjunctivae normal.     Pupils: Pupils are equal, round, and reactive to light.  Cardiovascular:     Rate and Rhythm: Normal rate and regular rhythm.     Pulses: Normal pulses.     Heart sounds: Normal heart sounds.  Pulmonary:     Effort: Pulmonary effort is normal.     Breath sounds: Normal breath  sounds.  Musculoskeletal:     Cervical back: Normal range of motion and neck supple.  Neurological:     General: No focal deficit present.     Mental Status: He is alert and oriented to person, place, and time.  Psychiatric:        Mood and Affect: Mood normal.        Behavior: Behavior normal.   Results for orders placed or performed in visit on 14/43/15  Basic Metabolic Panel  Result Value Ref Range   Glucose 222 (H) 70 - 99 mg/dL   BUN 24 8 - 27 mg/dL   Creatinine, Ser 1.09 0.76 - 1.27 mg/dL   eGFR 76 >59 mL/min/1.73   BUN/Creatinine Ratio 22 10 - 24   Sodium 135 134 - 144 mmol/L   Potassium 4.8 3.5 - 5.2 mmol/L   Chloride 100 96 - 106 mmol/L   CO2 22 20 - 29 mmol/L   Calcium 9.8 8.6 - 10.2 mg/dL  POCT glycosylated hemoglobin (Hb A1C)  Result Value Ref Range   Hemoglobin A1C 10.3 (A) 4.0 - 5.6 %   HbA1c POC (<> result, manual entry)     HbA1c, POC (prediabetic range)     HbA1c, POC (controlled diabetic range)      ASSESSMENT AND PLAN: 1. Type 2 diabetes mellitus without complication, without long-term current use of insulin ([place]): - Hemoglobin A1c not at goal at 10.3%, goal < 7%.  - Begin Metformin as prescribed. Counseled on medication adherence and adverse effects.  - Discussed the importance of healthy eating habits, low-carbohydrate diet, low-sugar diet, regular aerobic exercise (at least 150 minutes a week as tolerated) and medication compliance to achieve or maintain control of diabetes. - BMP to evaluate kidney function and electrolyte balance. - Patient wants more information about whether he has pancreas issues or insulin resistance issues.  - Referral to Endocrinology for further evaluation and management.  - Follow-up with primary provider as scheduled. - POCT glycosylated hemoglobin (Hb Q0G) - Basic Metabolic Panel - metFORMIN (GLUCOPHAGE) 500 MG tablet; Take 1 tablet (500 mg total) by mouth 2 (two) times daily with a meal.  Dispense: 60 tablet; Refill: 0 -  Ambulatory referral to Endocrinology - glucose blood (TRUE METRIX BLOOD GLUCOSE TEST) test strip; Use as instructed  Dispense: 100 each; Refill: 12 - Blood Glucose Monitoring Suppl (TRUE METRIX METER) w/Device KIT; Use as directed  Dispense: 1 kit; Refill: 0 - TRUEplus Lancets 28G MISC; Use as directed  Dispense: 100 each; Refill: 4  2. Discomfort of left ear: - Referral to ENT for further evaluation and management.  - Ambulatory referral to ENT   Patient was given the opportunity to ask questions.  Patient verbalized understanding of the plan  and was able to repeat key elements of the plan. Patient was given clear instructions to go to Emergency Department or return to medical center if symptoms don't improve, worsen, or new problems develop.The patient verbalized understanding.   Orders Placed This Encounter  Procedures   Basic Metabolic Panel   Ambulatory referral to ENT   Ambulatory referral to Endocrinology   POCT glycosylated hemoglobin (Hb A1C)     Requested Prescriptions   Signed Prescriptions Disp Refills   metFORMIN (GLUCOPHAGE) 500 MG tablet 60 tablet 0    Sig: Take 1 tablet (500 mg total) by mouth 2 (two) times daily with a meal.   glucose blood (TRUE METRIX BLOOD GLUCOSE TEST) test strip 100 each 12    Sig: Use as instructed   Blood Glucose Monitoring Suppl (TRUE METRIX METER) w/Device KIT 1 kit 0    Sig: Use as directed   TRUEplus Lancets 28G MISC 100 each 4    Sig: Use as directed    Follow-up with primary provider as scheduled.  Camillia Herter, NP

## 2021-07-23 LAB — BASIC METABOLIC PANEL
BUN/Creatinine Ratio: 22 (ref 10–24)
BUN: 24 mg/dL (ref 8–27)
CO2: 22 mmol/L (ref 20–29)
Calcium: 9.8 mg/dL (ref 8.6–10.2)
Chloride: 100 mmol/L (ref 96–106)
Creatinine, Ser: 1.09 mg/dL (ref 0.76–1.27)
Glucose: 222 mg/dL — ABNORMAL HIGH (ref 70–99)
Potassium: 4.8 mmol/L (ref 3.5–5.2)
Sodium: 135 mmol/L (ref 134–144)
eGFR: 76 mL/min/{1.73_m2} (ref >59)

## 2021-07-23 LAB — POCT GLYCOSYLATED HEMOGLOBIN (HGB A1C): Hemoglobin A1C: 10.3 % — AB (ref 4.0–5.6)

## 2021-07-23 NOTE — Progress Notes (Signed)
Diabetes discussed in office.

## 2021-07-23 NOTE — Progress Notes (Signed)
Kidney function normal

## 2021-07-31 ENCOUNTER — Other Ambulatory Visit: Payer: Self-pay | Admitting: Orthopedic Surgery

## 2021-07-31 DIAGNOSIS — M25521 Pain in right elbow: Secondary | ICD-10-CM

## 2021-08-10 DIAGNOSIS — B078 Other viral warts: Secondary | ICD-10-CM | POA: Diagnosis not present

## 2021-08-10 DIAGNOSIS — L821 Other seborrheic keratosis: Secondary | ICD-10-CM | POA: Diagnosis not present

## 2021-08-18 ENCOUNTER — Ambulatory Visit
Admission: RE | Admit: 2021-08-18 | Discharge: 2021-08-18 | Disposition: A | Discharge status: Home / Self Care | Payer: Medicare HMO | Source: Ambulatory Visit | Attending: Orthopedic Surgery | Admitting: Orthopedic Surgery

## 2021-08-18 ENCOUNTER — Other Ambulatory Visit: Payer: Self-pay

## 2021-08-18 DIAGNOSIS — M25721 Osteophyte, right elbow: Secondary | ICD-10-CM | POA: Diagnosis not present

## 2021-08-18 DIAGNOSIS — M7989 Other specified soft tissue disorders: Secondary | ICD-10-CM | POA: Diagnosis not present

## 2021-08-18 DIAGNOSIS — M19021 Primary osteoarthritis, right elbow: Secondary | ICD-10-CM | POA: Diagnosis not present

## 2021-08-18 DIAGNOSIS — M25521 Pain in right elbow: Secondary | ICD-10-CM

## 2021-08-20 ENCOUNTER — Other Ambulatory Visit: Payer: Self-pay | Admitting: Pulmonary Disease

## 2021-08-21 NOTE — Progress Notes (Signed)
? ? ?Patient ID: Ronnie Carey, male    DOB: [birth year]  MRN: [id number] ? ?CC: Diabetes Follow-Up ? ?Subjective: ?Ronnie Carey is a 66 y.o. male who presents for diabetes follow-up.  ? ?His concerns today include:  ?DIABETES TYPE 2 FOLLOW-UP: ?07/22/2021: ?- Begin Metformin as prescribed. Counseled on medication adherence and adverse effects.  ?- Patient wants more information about whether he has pancreas issues or insulin resistance issues.  ?- Referral to Endocrinology for further evaluation and management.  ? ?08/26/2021: ?Doing well on current Metformin. Unable to receive blood glucose monitoring equipment from pharmacy since last appointment. Eating meat only diet. Has not heard from Endocrinology referral as of present.  ? ?Patient Active Problem List  ? Diagnosis Date Noted  ? Dyslipidemia (high LDL; low HDL) 07/22/2021  ? Hyperlipidemia, unspecified 07/22/2021  ? Joint pain 07/22/2021  ? Low back pain 07/22/2021  ? Obesity 07/22/2021  ? Osteoarthritis of multiple joints 07/22/2021  ? Acute saddle pulmonary embolism ([place]) 10/13/2020  ? 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 Gastro-esophageal reflux disease without esophagitis 02/02/2019  ? History of pulmonary embolism 02/02/2019  ? Osteoarthritis of left knee 02/02/2019  ? Personal history of DVT (deep vein thrombosis) 01/10/2019  ? Unilateral primary osteoarthritis, left knee 08/29/2018  ? Incarcerated umbilical hernia 25/63/8937  ? Osteoarthritis 06/07/2008  ?  ? ?Current Outpatient Medications on File Prior to Visit  ?Medication Sig Dispense Refill  ? ELIQUIS 5 MG TABS tablet TAKE 1 TABLET BY MOUTH TWICE A DAY 60 tablet 3  ? ibuprofen (ADVIL) 800 MG tablet     ? omeprazole (PRILOSEC) 20 MG capsule     ? triamcinolone ointment (KENALOG) 0.5 % Apply 1 application topically 2 (two) times daily. 80 g 0  ? ?No current facility-administered medications on file prior to visit.  ? ? ?No Known Allergies ? ?Social History  ? ?Socioeconomic  History  ? Marital status: Single  ?  Spouse name: Not on file  ? Number of children: Not on file  ? Years of education: Not on file  ? Highest education level: Not on file  ?Occupational History  ? Not on file  ?Tobacco Use  ? Smoking status: Never  ? Smokeless tobacco: Never  ?Vaping Use  ? Vaping Use: Never used  ?Substance and Sexual Activity  ? Alcohol use: Never  ? Drug use: Never  ? Sexual activity: Not Currently  ?Other Topics Concern  ? Not on file  ?Social History Narrative  ? Not on file  ? ?Social Determinants of Health  ? ?Financial Resource Strain: Not on file  ?Food Insecurity: Not on file  ?Transportation Needs: Not on file  ?Physical Activity: Not on file  ?Stress: Not on file  ?Social Connections: Not on file  ?Intimate Partner Violence: Not on file  ? ? ?Family History  ?Problem Relation Age of Onset  ? Stroke Mother   ?     At age of 24, history of smoking  ? ? ?Past Surgical History:  ?Procedure Laterality Date  ? TONSILLECTOMY    ? TOTAL KNEE ARTHROPLASTY Right 2012  ? ? ?ROS: ?Review of Systems ?Negative except as stated above ? ?PHYSICAL EXAM: ?BP 118/78 (BP Location: Left Arm, Patient Position: Sitting, Cuff Size: Large)   Pulse 61   Temp 98.3 ?F (36.8 ?C)   Resp 18   Ht 5' 10.98" (1.803 m)   Wt 233 lb (105.7 kg)   SpO2 96%   BMI 32.51 kg/m?  ? ?  Physical Exam ?HENT:  ?   Head: Normocephalic and atraumatic.  ?Eyes:  ?   Extraocular Movements: Extraocular movements intact.  ?   Conjunctiva/sclera: Conjunctivae normal.  ?   Pupils: Pupils are equal, round, and reactive to light.  ?Cardiovascular:  ?   Rate and Rhythm: Normal rate and regular rhythm.  ?   Pulses: Normal pulses.  ?   Heart sounds: Normal heart sounds.  ?Pulmonary:  ?   Effort: Pulmonary effort is normal.  ?   Breath sounds: Normal breath sounds.  ?Musculoskeletal:  ?   Cervical back: Normal range of motion and neck supple.  ?Neurological:  ?   General: No focal deficit present.  ?   Mental Status: He is alert and oriented  to person, place, and time.  ?Psychiatric:     ?   Mood and Affect: Mood normal.     ?   Behavior: Behavior normal.  ? ? ?Results for orders placed or performed in visit on 08/26/21  ?POCT glycosylated hemoglobin (Hb A1C)  ?Result Value Ref Range  ? Hemoglobin A1C 8.1 (A) 4.0 - 5.6 %  ? HbA1c POC (<> result, manual entry)    ? HbA1c, POC (prediabetic range)    ? HbA1c, POC (controlled diabetic range)    ? ? ?ASSESSMENT AND PLAN: ?1. Type 2 diabetes mellitus without complication, without long-term current use of insulin ([place]): ?- Hemoglobin A1c relative to goal today at 8.1%, goal < 8%. This is improved from previous 10.3% on 07/23/2021. ?- Continue Metformin as prescribed.  ?- Discussed the importance of healthy eating habits, low-carbohydrate diet, low-sugar diet, regular aerobic exercise (at least 150 minutes a week as tolerated) and medication compliance to achieve or maintain control of diabetes. ?- Continue with referral to Endocrinology for further evaluation and management per patient preference.  ?- Follow-up with primary provider as scheduled.  ?- POCT glycosylated hemoglobin (Hb A1C) ?- Blood Glucose Monitoring Suppl (TRUE METRIX METER) w/Device KIT; Use as directed  Dispense: 1 kit; Refill: 0 ?- glucose blood (TRUE METRIX BLOOD GLUCOSE TEST) test strip; Use as instructed  Dispense: 100 each; Refill: 12 ?- TRUEplus Lancets 28G MISC; Use as directed  Dispense: 100 each; Refill: 4 ?- Ambulatory referral to Endocrinology ?- metFORMIN (GLUCOPHAGE) 500 MG tablet; Take 1 tablet (500 mg total) by mouth 2 (two) times daily with a meal.  Dispense: 180 tablet; Refill: 0 ? ? ?Patient was given the opportunity to ask questions.  Patient verbalized understanding of the plan and was able to repeat key elements of the plan. Patient was given clear instructions to go to Emergency Department or return to medical center if symptoms don't improve, worsen, or new problems develop.The patient verbalized  understanding. ? ? ?Orders Placed This Encounter  ?Procedures  ? Ambulatory referral to Endocrinology  ? POCT glycosylated hemoglobin (Hb A1C)  ? ? ?Requested Prescriptions  ? ?Signed Prescriptions Disp Refills  ? Blood Glucose Monitoring Suppl (TRUE METRIX METER) w/Device KIT 1 kit 0  ?  Sig: Use as directed  ? glucose blood (TRUE METRIX BLOOD GLUCOSE TEST) test strip 100 each 12  ?  Sig: Use as instructed  ? TRUEplus Lancets 28G MISC 100 each 4  ?  Sig: Use as directed  ? metFORMIN (GLUCOPHAGE) 500 MG tablet 180 tablet 0  ?  Sig: Take 1 tablet (500 mg total) by mouth 2 (two) times daily with a meal.  ? ? ?Follow-up with primary provider as scheduled. ? ?Camillia Herter, NP  ?

## 2021-08-25 DIAGNOSIS — M25521 Pain in right elbow: Secondary | ICD-10-CM | POA: Diagnosis not present

## 2021-08-26 ENCOUNTER — Ambulatory Visit: Payer: Medicare HMO | Admitting: Family

## 2021-08-26 ENCOUNTER — Ambulatory Visit (INDEPENDENT_AMBULATORY_CARE_PROVIDER_SITE_OTHER): Payer: Medicare HMO | Admitting: Family

## 2021-08-26 ENCOUNTER — Other Ambulatory Visit: Payer: Self-pay

## 2021-08-26 VITALS — BP 118/78 | HR 61 | Temp 98.3°F | Resp 18 | Ht 70.98 in | Wt 233.0 lb

## 2021-08-26 DIAGNOSIS — E119 Type 2 diabetes mellitus without complications: Secondary | ICD-10-CM

## 2021-08-26 LAB — POCT GLYCOSYLATED HEMOGLOBIN (HGB A1C): Hemoglobin A1C: 8.1 % — AB (ref 4.0–5.6)

## 2021-08-26 MED ORDER — METFORMIN HCL 500 MG PO TABS: 500.0000 mg | ORAL_TABLET | Freq: Two times a day (BID) | ORAL | 0 refills | Status: DC

## 2021-08-26 MED ORDER — TRUEPLUS LANCETS 28G MISC: 4 refills | Status: AC

## 2021-08-26 MED ORDER — TRUE METRIX BLOOD GLUCOSE TEST VI STRP: ORAL_STRIP | 12 refills | Status: AC

## 2021-08-26 MED ORDER — TRUE METRIX METER W/DEVICE KIT: PACK | 0 refills | Status: AC

## 2021-08-26 NOTE — Progress Notes (Signed)
Diabetes discussed in office.

## 2021-08-26 NOTE — Progress Notes (Signed)
Pt presents for diabetes follow-up  

## 2021-09-03 ENCOUNTER — Telehealth: Payer: Self-pay | Admitting: Pulmonary Disease

## 2021-09-03 NOTE — Telephone Encounter (Signed)
Fax received from Dr. Ramond Marrow with [name] to perform a Right elbow capsular release, outerbridge on patient.  Patient needs surgery clearance. Patient was seen on 06/04/2021. Office protocol is a risk assessment can be sent to surgeon if patient has been seen in 60 days or less.  ? ?Sending to Dr. Judeth Horn for risk assessment or recommendations if patient needs to be seen in office prior to surgical procedure.   ?

## 2021-09-04 NOTE — Telephone Encounter (Signed)
No office visit is necessary. ? ?Pulmonary medicine does not provide clearance prior to surgery, that is up to the surgeon.  A preoperative risk assessment is performed.  Based on the ARISCAT model, patient is low risk or 1.3% risk of postoperative respiratory complication assuming surgical procedure is less than 3 hours, if greater than 3 hours patient is intermediate risk or 13.3% risk of postoperative respiratory complication.  Please see below for recommendations: ?--Avoid general anesthesia, local versus nerve block as able ?--If general anesthesia is required, avoid prolonged neuromuscular blockade ?--If general anesthesia is required, recommend intraoperative blood gas ?--If blood gas demonstrates carbon oxide retention, recommend extubation to BiPAP and post extubation blood gas to assess for ongoing CO2 retention ?--Patient is on the blood thinner for recurrent VTE, recommendations for stopping preoperative anticoagulation at the discretion of the orthopedic surgeon, would recommend resuming anticoagulation postoperatively

## 2021-09-14 NOTE — Telephone Encounter (Signed)
OV note and clearance form have been faxed back to [facility]. Nothing further needed. ?

## 2021-09-16 ENCOUNTER — Other Ambulatory Visit: Payer: Self-pay | Admitting: Physician Assistant

## 2021-09-16 ENCOUNTER — Other Ambulatory Visit: Payer: Self-pay

## 2021-09-16 ENCOUNTER — Inpatient Hospital Stay: Payer: Medicare HMO | Attending: Physician Assistant

## 2021-09-16 ENCOUNTER — Inpatient Hospital Stay (HOSPITAL_BASED_OUTPATIENT_CLINIC_OR_DEPARTMENT_OTHER): Payer: Medicare HMO | Admitting: Physician Assistant

## 2021-09-16 VITALS — BP 120/83 | HR 73 | Temp 97.8°F | Resp 18 | Ht 70.98 in | Wt 240.0 lb

## 2021-09-16 DIAGNOSIS — I825Z3 Chronic embolism and thrombosis of unspecified deep veins of distal lower extremity, bilateral: Secondary | ICD-10-CM | POA: Diagnosis not present

## 2021-09-16 DIAGNOSIS — R197 Diarrhea, unspecified: Secondary | ICD-10-CM | POA: Diagnosis not present

## 2021-09-16 DIAGNOSIS — Z79899 Other long term (current) drug therapy: Secondary | ICD-10-CM | POA: Diagnosis not present

## 2021-09-16 DIAGNOSIS — I2692 Saddle embolus of pulmonary artery without acute cor pulmonale: Secondary | ICD-10-CM | POA: Diagnosis not present

## 2021-09-16 DIAGNOSIS — Z7901 Long term (current) use of anticoagulants: Secondary | ICD-10-CM | POA: Insufficient documentation

## 2021-09-16 DIAGNOSIS — Z823 Family history of stroke: Secondary | ICD-10-CM | POA: Diagnosis not present

## 2021-09-16 DIAGNOSIS — Z86711 Personal history of pulmonary embolism: Secondary | ICD-10-CM | POA: Diagnosis not present

## 2021-09-16 DIAGNOSIS — Z86718 Personal history of other venous thrombosis and embolism: Secondary | ICD-10-CM | POA: Insufficient documentation

## 2021-09-16 LAB — CBC WITH DIFFERENTIAL (CANCER CENTER ONLY)
Abs Immature Granulocytes: 0.01 10*3/uL (ref 0.00–0.07)
Basophils Absolute: 0 10*3/uL (ref 0.0–0.1)
Basophils Relative: 1 %
Eosinophils Absolute: 0.5 10*3/uL (ref 0.0–0.5)
Eosinophils Relative: 7 %
HCT: 41.6 % (ref 39.0–52.0)
Hemoglobin: 14.4 g/dL (ref 13.0–17.0)
Immature Granulocytes: 0 %
Lymphocytes Relative: 30 %
Lymphs Abs: 2 10*3/uL (ref 0.7–4.0)
MCH: 30.2 pg (ref 26.0–34.0)
MCHC: 34.6 g/dL (ref 30.0–36.0)
MCV: 87.2 fL (ref 80.0–100.0)
Monocytes Absolute: 0.6 10*3/uL (ref 0.1–1.0)
Monocytes Relative: 10 %
Neutro Abs: 3.5 10*3/uL (ref 1.7–7.7)
Neutrophils Relative %: 52 %
Platelet Count: 285 10*3/uL (ref 150–400)
RBC: 4.77 MIL/uL (ref 4.22–5.81)
RDW: 12.9 % (ref 11.5–15.5)
WBC Count: 6.6 10*3/uL (ref 4.0–10.5)
nRBC: 0 % (ref 0.0–0.2)

## 2021-09-16 LAB — CMP (CANCER CENTER ONLY)
ALT: 21 U/L (ref 0–44)
AST: 15 U/L (ref 15–41)
Albumin: 4.5 g/dL (ref 3.5–5.0)
Alkaline Phosphatase: 68 U/L (ref 38–126)
Anion gap: 7 (ref 5–15)
BUN: 16 mg/dL (ref 8–23)
CO2: 28 mmol/L (ref 22–32)
Calcium: 9.6 mg/dL (ref 8.9–10.3)
Chloride: 101 mmol/L (ref 98–111)
Creatinine: 1.13 mg/dL (ref 0.61–1.24)
GFR, Estimated: >60 mL/min (ref >60)
Glucose, Bld: 193 mg/dL — ABNORMAL HIGH (ref 70–99)
Potassium: 4 mmol/L (ref 3.5–5.1)
Sodium: 136 mmol/L (ref 135–145)
Total Bilirubin: 0.5 mg/dL (ref 0.3–1.2)
Total Protein: 7.8 g/dL (ref 6.5–8.1)

## 2021-09-16 MED ORDER — APIXABAN 2.5 MG PO TABS
2.5000 mg | ORAL_TABLET | Freq: Two times a day (BID) | ORAL | 6 refills | Status: DC
Start: 2021-09-16 — End: 2022-02-26

## 2021-09-16 NOTE — Progress Notes (Signed)
?[place] ?[phone]   [phone] ? ?PROGRESS NOTE ? ?Patient Care Team: ?Camillia Herter, NP as PCP - General (Nurse Practitioner) ? ?Hematological/Oncological History ?1) 10/01/2010: After knee replacement, patient developed lower extremity swelling and chest pain.  ?-Doppler US: Extensive left lower extremity DVT involving the inferior common femoral vein and popliteal vein.  ?-CT chest: acute pulmonary embolism involving a branch of the right lower lobe pulmonary artery.  ?-Completed course of anticoagulation with improvement of symptoms.  ? ?2) 10/13/2020-10/16/2020: Admitted for acute onset shortness of breath and substernal chest pain.  ?-CTA chest: Significant bilateral pulmonary emboli with evidence of saddle component centrally. Elevated RV/LV ratio is noted of 3.Patchy airspace opacity in the medial aspect of the left upper lobe ?which may represent edema or early infiltrate. ?-Doppler US:Findings consistent with acute deep vein thrombosis involving the right femoral vein, right popliteal vein,right posterior tibial veins, right peroneal veins, and right gastrocnemius veins. DVT in the popliteal vein is mobile. Findings consistent with age indeterminate deep vein thrombosis involving the left common femoral vein, SF junction, left femoral vein, left proximal profunda vein, left popliteal vein, left posterior tibial veins, and left ?gastrocnemius veins. ?-Echo: Moderately reduced right ventricular function secondary to PE ?-Initially treated with heparin infusion and then transitioned to Eliquis. ? ?3) 03/17/2021: Establish care with Dede Query PA-C  ? ?4) 09/16/2021: Transitioned to maintenance dose of Eliquis 2.5 mg twice daily.  ? ?CHIEF COMPLAINTS/PURPOSE OF CONSULTATION:  ?"Recurrent DVT and PE " ? ?HISTORY OF PRESENTING ILLNESS:  ?Ronnie Carey 66 y.o. 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 He is unaccompanied  for this visit.  ? ?At today's visit, Ronnie Carey reports his energy levels are fairly stable. He continues to complete his daily activities on his own. He is trying to eat healthier by cutting out carbs and sugar. He denies nausea, vomiting or abdominal pain. His bowel habits are unchanged. He reports loose stools due to metformin but no watery diarrhea. He denies easy bruising or signs of active bleeding. He reports right leg swelling has nearly resolved. He denies fevers, chills, night sweats, shortness of breath, chest pain or cough. He has no other complaints. Rest of the 10 point ROS is below.  ? ?MEDICAL HISTORY:  ?Past Medical History:  ?Diagnosis Date  ? History of pulmonary embolus (PE)   ? SBO (small bowel obstruction) ([place]) 05/2018  ? ? ?SURGICAL HISTORY: ?Past Surgical History:  ?Procedure Laterality Date  ? TONSILLECTOMY    ? TOTAL KNEE ARTHROPLASTY Right 2012  ? ? ?SOCIAL HISTORY: ?Social History  ? ?Socioeconomic History  ? Marital status: Single  ?  Spouse name: Not on file  ? Number of children: Not on file  ? Years of education: Not on file  ? Highest education level: Not on file  ?Occupational History  ? Not on file  ?Tobacco Use  ? Smoking status: Never  ? Smokeless tobacco: Never  ?Vaping Use  ? Vaping Use: Never used  ?Substance and Sexual Activity  ? Alcohol use: Never  ? Drug use: Never  ? Sexual activity: Not Currently  ?Other Topics Concern  ? Not on file  ?Social History Narrative  ? Not on file  ? ?Social Determinants of Health  ? ?Financial Resource Strain: Not on file  ?Food Insecurity: Not on file  ?Transportation Needs: Not on file  ?Physical Activity: Not on file  ?Stress: Not on file  ?  Social Connections: Not on file  ?Intimate Partner Violence: Not on file  ? ? ?FAMILY HISTORY: ?Family History  ?Problem Relation Age of Onset  ? Stroke Mother   ?     At age of 31, history of smoking  ? ? ?ALLERGIES:  has No Known Allergies. ? ?MEDICATIONS:  ?Current Outpatient Medications   ?Medication Sig Dispense Refill  ? apixaban (ELIQUIS) 2.5 MG TABS tablet Take 1 tablet (2.5 mg total) by mouth 2 (two) times daily. 60 tablet 6  ? Blood Glucose Monitoring Suppl (TRUE METRIX METER) w/Device KIT Use as directed 1 kit 0  ? glucose blood (TRUE METRIX BLOOD GLUCOSE TEST) test strip Use as instructed 100 each 12  ? ibuprofen (ADVIL) 800 MG tablet     ? metFORMIN (GLUCOPHAGE) 500 MG tablet Take 1 tablet (500 mg total) by mouth 2 (two) times daily with a meal. 180 tablet 0  ? omeprazole (PRILOSEC) 20 MG capsule     ? triamcinolone ointment (KENALOG) 0.5 % Apply 1 application topically 2 (two) times daily. 80 g 0  ? TRUEplus Lancets 28G MISC Use as directed 100 each 4  ? ?No current facility-administered medications for this visit.  ? ? ?REVIEW OF SYSTEMS:   ?Constitutional: ( - ) fevers, ( - )  chills , ( - ) night sweats ?Eyes: ( - ) blurriness of vision, ( - ) double vision, ( - ) watery eyes ?Ears, nose, mouth, throat, and face: ( - ) mucositis, ( - ) sore throat ?Respiratory: ( - ) cough, ( - ) dyspnea, ( - ) wheezes ?Cardiovascular: ( - ) palpitation, ( - ) chest discomfort, ( - ) lower extremity swelling ?Gastrointestinal:  ( - ) nausea, ( - ) heartburn, ( - ) change in bowel habits ?Skin: ( - ) abnormal skin rashes ?Lymphatics: ( - ) new lymphadenopathy, ( - ) easy bruising ?Neurological: ( - ) numbness, ( - ) tingling, ( - ) new weaknesses ?Behavioral/Psych: ( - ) mood change, ( - ) new changes  ?All other systems were reviewed with the patient and are negative. ? ?PHYSICAL EXAMINATION: ?ECOG PERFORMANCE STATUS: 0 - Asymptomatic ? ?Vitals:  ? 09/16/21 1025  ?BP: 120/83  ?Pulse: 73  ?Resp: 18  ?Temp: 97.8 ?F (36.6 ?C)  ?SpO2: 98%  ? ?Filed Weights  ? 09/16/21 1025  ?Weight: 240 lb (108.9 kg)  ? ? ?GENERAL: well appearing male in NAD  ?SKIN: skin color, texture, turgor are normal, no rashes or significant lesions ?EYES: conjunctiva are pink and non-injected, sclera clear ?LUNGS: clear to  auscultation and percussion with normal breathing effort ?HEART: regular rate & rhythm and no murmurs. No lower extremity edema.  ?Musculoskeletal: no cyanosis of digits and no clubbing  ?PSYCH: alert & oriented x 3, fluent speech ?NEURO: no focal motor/sensory deficits ? ?LABORATORY DATA:  ?I have reviewed the data as listed ? ?  Latest Ref Rng & Units 09/16/2021  ? 10:15 AM 03/17/2021  ? 12:25 PM 10/24/2020  ? 11:51 AM  ?CBC  ?WBC 4.0 - 10.5 K/uL 6.6   8.1   6.3    ?Hemoglobin 13.0 - 17.0 g/dL 14.4   13.9   14.1    ?Hematocrit 39.0 - 52.0 % 41.6   39.5   43.7    ?Platelets 150 - 400 K/uL 285   328   358    ? ? ? ?  Latest Ref Rng & Units 09/16/2021  ? 10:15 AM 07/22/2021  ?  3:01 PM 03/17/2021  ? 12:25 PM  ?CMP  ?Glucose 70 - 99 mg/dL 193   222   125    ?BUN 8 - 23 mg/dL $Remove'16   24   17    'DtWXrYY$ ?Creatinine 0.61 - 1.24 mg/dL 1.13   1.09   1.08    ?Sodium 135 - 145 mmol/L 136   135   139    ?Potassium 3.5 - 5.1 mmol/L 4.0   4.8   4.1    ?Chloride 98 - 111 mmol/L 101   100   106    ?CO2 22 - 32 mmol/L $RemoveB'28   22   24    'UFekEjCA$ ?Calcium 8.9 - 10.3 mg/dL 9.6   9.8   9.4    ?Total Protein 6.5 - 8.1 g/dL 7.8    7.7    ?Total Bilirubin 0.3 - 1.2 mg/dL 0.5    0.4    ?Alkaline Phos 38 - 126 U/L 68    70    ?AST 15 - 41 U/L 15    15    ?ALT 0 - 44 U/L 21    19    ? ? ?RADIOGRAPHIC STUDIES: ?I have personally reviewed the radiological images as listed and agreed with the findings in the report. ?CT ELBOW RIGHT WO CONTRAST ? ?Result Date: 08/18/2021 ?CLINICAL DATA:  Right elbow pain and swelling. Fell on elbow in 2006. No history of surgery. EXAM: CT OF THE UPPER RIGHT EXTREMITY WITHOUT CONTRAST TECHNIQUE: Multidetector CT imaging of the upper right extremity was performed according to the standard protocol. RADIATION DOSE REDUCTION: This exam was performed according to the departmental dose-optimization program which includes automated exposure control, adjustment of the mA and/or kV according to patient size and/or use of iterative reconstruction  technique. COMPARISON:  None. 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

## 2021-09-27 ENCOUNTER — Encounter: Payer: Self-pay | Admitting: Family

## 2021-09-28 ENCOUNTER — Emergency Department (HOSPITAL_COMMUNITY): Payer: Medicare HMO

## 2021-09-28 ENCOUNTER — Encounter (HOSPITAL_COMMUNITY): Payer: Self-pay | Admitting: Emergency Medicine

## 2021-09-28 ENCOUNTER — Other Ambulatory Visit: Payer: Self-pay

## 2021-09-28 ENCOUNTER — Emergency Department (HOSPITAL_COMMUNITY)
Admission: EM | Admit: 2021-09-28 | Discharge: 2021-09-28 | Disposition: A | Discharge status: Home / Self Care | Payer: Medicare HMO | Attending: Emergency Medicine | Admitting: Emergency Medicine

## 2021-09-28 DIAGNOSIS — E1165 Type 2 diabetes mellitus with hyperglycemia: Secondary | ICD-10-CM | POA: Diagnosis not present

## 2021-09-28 DIAGNOSIS — H538 Other visual disturbances: Secondary | ICD-10-CM | POA: Insufficient documentation

## 2021-09-28 DIAGNOSIS — Z7984 Long term (current) use of oral hypoglycemic drugs: Secondary | ICD-10-CM | POA: Insufficient documentation

## 2021-09-28 DIAGNOSIS — H539 Unspecified visual disturbance: Secondary | ICD-10-CM

## 2021-09-28 DIAGNOSIS — Z7901 Long term (current) use of anticoagulants: Secondary | ICD-10-CM | POA: Diagnosis not present

## 2021-09-28 DIAGNOSIS — H547 Unspecified visual loss: Secondary | ICD-10-CM | POA: Diagnosis not present

## 2021-09-28 DIAGNOSIS — H53002 Unspecified amblyopia, left eye: Secondary | ICD-10-CM | POA: Diagnosis not present

## 2021-09-28 LAB — DIFFERENTIAL
Abs Immature Granulocytes: 0.01 10*3/uL (ref 0.00–0.07)
Basophils Absolute: 0 10*3/uL (ref 0.0–0.1)
Basophils Relative: 1 %
Eosinophils Absolute: 0.4 10*3/uL (ref 0.0–0.5)
Eosinophils Relative: 6 %
Immature Granulocytes: 0 %
Lymphocytes Relative: 35 %
Lymphs Abs: 2.3 10*3/uL (ref 0.7–4.0)
Monocytes Absolute: 0.6 10*3/uL (ref 0.1–1.0)
Monocytes Relative: 9 %
Neutro Abs: 3.3 10*3/uL (ref 1.7–7.7)
Neutrophils Relative %: 49 %

## 2021-09-28 LAB — COMPREHENSIVE METABOLIC PANEL
ALT: 25 U/L (ref 0–44)
AST: 20 U/L (ref 15–41)
Albumin: 4.2 g/dL (ref 3.5–5.0)
Alkaline Phosphatase: 60 U/L (ref 38–126)
Anion gap: 8 (ref 5–15)
BUN: 13 mg/dL (ref 8–23)
CO2: 27 mmol/L (ref 22–32)
Calcium: 9.1 mg/dL (ref 8.9–10.3)
Chloride: 103 mmol/L (ref 98–111)
Creatinine, Ser: 1.11 mg/dL (ref 0.61–1.24)
GFR, Estimated: >60 mL/min (ref >60)
Glucose, Bld: 152 mg/dL — ABNORMAL HIGH (ref 70–99)
Potassium: 3.9 mmol/L (ref 3.5–5.1)
Sodium: 138 mmol/L (ref 135–145)
Total Bilirubin: 0.5 mg/dL (ref 0.3–1.2)
Total Protein: 7.1 g/dL (ref 6.5–8.1)

## 2021-09-28 LAB — CBC
HCT: 43.1 % (ref 39.0–52.0)
Hemoglobin: 14.8 g/dL (ref 13.0–17.0)
MCH: 30.7 pg (ref 26.0–34.0)
MCHC: 34.3 g/dL (ref 30.0–36.0)
MCV: 89.4 fL (ref 80.0–100.0)
Platelets: 293 10*3/uL (ref 150–400)
RBC: 4.82 MIL/uL (ref 4.22–5.81)
RDW: 12.8 % (ref 11.5–15.5)
WBC: 6.6 10*3/uL (ref 4.0–10.5)
nRBC: 0 % (ref 0.0–0.2)

## 2021-09-28 LAB — C-REACTIVE PROTEIN: CRP: 0.9 mg/dL (ref <1.0)

## 2021-09-28 LAB — I-STAT CHEM 8, ED
BUN: 13 mg/dL (ref 8–23)
Calcium, Ion: 0.99 mmol/L — ABNORMAL LOW (ref 1.15–1.40)
Chloride: 104 mmol/L (ref 98–111)
Creatinine, Ser: 1 mg/dL (ref 0.61–1.24)
Glucose, Bld: 145 mg/dL — ABNORMAL HIGH (ref 70–99)
HCT: 43 % (ref 39.0–52.0)
Hemoglobin: 14.6 g/dL (ref 13.0–17.0)
Potassium: 3.8 mmol/L (ref 3.5–5.1)
Sodium: 138 mmol/L (ref 135–145)
TCO2: 26 mmol/L (ref 22–32)

## 2021-09-28 LAB — CBG MONITORING, ED: Glucose-Capillary: 145 mg/dL — ABNORMAL HIGH (ref 70–99)

## 2021-09-28 LAB — PROTIME-INR
INR: 1.1 (ref 0.8–1.2)
Prothrombin Time: 14.4 seconds (ref 11.4–15.2)

## 2021-09-28 LAB — APTT: aPTT: 32 seconds (ref 24–36)

## 2021-09-28 LAB — SEDIMENTATION RATE: Sed Rate: 6 mm/hr (ref 0–16)

## 2021-09-28 MED ORDER — SODIUM CHLORIDE 0.9% FLUSH: 3.0000 mL | Freq: Once | INTRAVENOUS | Status: DC

## 2021-09-28 NOTE — ED Notes (Signed)
Pt stated his heart is fine when informed he needed an EKG.  ? ?EDP informed pt need procedure to r/o an optic stroke.  ?

## 2021-09-28 NOTE — ED Provider Notes (Signed)
?[place] ?Provider Note ? ? ?CSN: [id number] ?Arrival date & time: 09/28/21  1146 ? ?  ? ?History ? ?Chief Complaint  ?Patient presents with  ? Loss of Vision  ? ? ?Ronnie Carey is a 66 y.o. male. ? ?Patient with history of PE and DVT on Eliquis and diabetes, presents with complaint of left eye vision change.  He states that since yesterday his left eye lower lateral peripheral vision is blurry and has a feathering/brush like movement that he sees persistently.  Otherwise no central vision loss.  No floating spots or flashing lights noted.  Denies any headache or chest pain.  Denies jaw pain.  No fevers vomiting cough or diarrhea reported. ? ? ?  ? ?Home Medications ?Prior to Admission medications   ?Medication Sig Start Date End Date Taking? Authorizing Provider  ?apixaban (ELIQUIS) 2.5 MG TABS tablet Take 1 tablet (2.5 mg total) by mouth 2 (two) times daily. 09/16/21   Lincoln Brigham, PA-C  ?Blood Glucose Monitoring Suppl (TRUE METRIX METER) w/Device KIT Use as directed 08/26/21   Camillia Herter, NP  ?glucose blood (TRUE METRIX BLOOD GLUCOSE TEST) test strip Use as instructed 08/26/21   Camillia Herter, NP  ?metFORMIN (GLUCOPHAGE) 500 MG tablet Take 1 tablet (500 mg total) by mouth 2 (two) times daily with a meal. 08/26/21 11/24/21  Camillia Herter, NP  ?triamcinolone ointment (KENALOG) 0.5 % Apply 1 application topically 2 (two) times daily. 06/15/21   Camillia Herter, NP  ?TRUEplus Lancets 28G MISC Use as directed 08/26/21   Camillia Herter, NP  ?   ? ?Allergies    ?Patient has no known allergies.   ? ?Review of Systems   ?Review of Systems  ?Constitutional:  Negative for fever.  ?HENT:  Negative for ear pain and sore throat.   ?Eyes:  Positive for visual disturbance. Negative for pain.  ?Respiratory:  Negative for cough.   ?Cardiovascular:  Negative for chest pain.  ?Gastrointestinal:  Negative for abdominal pain.  ?Genitourinary:  Negative for flank pain.   ?Musculoskeletal:  Negative for back pain.  ?Skin:  Negative for color change and rash.  ?Neurological:  Negative for syncope.  ?All other systems reviewed and are negative. ? ?Physical Exam ?Updated Vital Signs ?BP 114/73 (BP Location: Left Arm)   Pulse (!) 57   Temp 98 ?F (36.7 ?C)   Resp 20   SpO2 98%  ?Physical Exam ?Constitutional:   ?   Appearance: He is well-developed.  ?HENT:  ?   Head: Normocephalic.  ?   Nose: Nose normal.  ?Eyes:  ?   General:     ?   Right eye: No discharge.     ?   Left eye: No discharge.  ?   Extraocular Movements: Extraocular movements intact.  ?   Conjunctiva/sclera: Conjunctivae normal.  ?   Pupils: Pupils are equal, round, and reactive to light.  ?   Comments: No afferent pupil defect.  No hyphema.  No conjunctivitis noted.  ?Cardiovascular:  ?   Rate and Rhythm: Normal rate.  ?Pulmonary:  ?   Effort: Pulmonary effort is normal.  ?Skin: ?   Coloration: Skin is not jaundiced.  ?Neurological:  ?   Mental Status: He is alert. Mental status is at baseline.  ? ? ?ED Results / Procedures / Treatments   ?Labs ?(all labs ordered are listed, but only abnormal results are displayed) ?Labs Reviewed  ?COMPREHENSIVE METABOLIC PANEL -  Abnormal; Notable for the following components:  ?    Result Value  ? Glucose, Bld 152 (*)   ? All other components within normal limits  ?I-STAT CHEM 8, ED - Abnormal; Notable for the following components:  ? Glucose, Bld 145 (*)   ? Calcium, Ion 0.99 (*)   ? All other components within normal limits  ?CBG MONITORING, ED - Abnormal; Notable for the following components:  ? Glucose-Capillary 145 (*)   ? All other components within normal limits  ?PROTIME-INR  ?APTT  ?CBC  ?DIFFERENTIAL  ?SEDIMENTATION RATE  ?C-REACTIVE PROTEIN  ? ? ?EKG ?None ? ?Radiology ?CT HEAD WO CONTRAST ? ?Result Date: 09/28/2021 ?CLINICAL DATA:  Vision loss, monocular vision loss EXAM: CT HEAD WITHOUT CONTRAST TECHNIQUE: Contiguous axial images were obtained from the base of the skull  through the vertex without intravenous contrast. RADIATION DOSE REDUCTION: This exam was performed according to the departmental dose-optimization program which includes automated exposure control, adjustment of the mA and/or kV according to patient size and/or use of iterative reconstruction technique. COMPARISON:  None. FINDINGS: Brain: No evidence of acute infarction, hemorrhage, hydrocephalus, extra-axial collection or mass lesion/mass effect. Vascular: No hyperdense vessel identified. Skull: No acute fracture. Sinuses/Orbits: Pansinus mucosal thickening with frothy secretions in the sphenoid sinuses. Other: Left canal wall down mastoidectomy. IMPRESSION: 1. No evidence of acute intracranial abnormality. If the patient's reported symptoms continue, consider MRI for more sensitive evaluation. 2. Paranasal sinus mucosal thickening. Electronically Signed   By: Margaretha Sheffield M.D.   On: 09/28/2021 13:00   ? ?Procedures ?Procedures  ? ? ?Medications Ordered in ED ?Medications  ?sodium chloride flush (NS) 0.9 % injection 3 mL (3 mLs Intravenous Not Given 09/28/21 1200)  ? ? ?ED Course/ Medical Decision Making/ A&P ?  ?                        ?Medical Decision Making ?Amount and/or Complexity of Data Reviewed ?Labs: ordered. ?Radiology: ordered. ? ? ?Chart review shows office visit September 16, 2021 and oncology. ? ?Labs are sent.  Chemistry unremarkable white count is normal.  CT of the head unremarkable per radiologist. ? ?Case discussed with ophthalmology who will see the patient today in the clinic.  Requested CRP and sed rate which was ordered and sent. ? ?Patient otherwise discharged to follow-up with ophthalmology clinic today.  Advised return for any additional concerns. ? ? ? ? ? ? ? ? ? ?Final Clinical Impression(s) / ED Diagnoses ?Final diagnoses:  ?Visual disturbance  ? ? ?Rx / DC Orders ?ED Discharge Orders   ? ? None  ? ?  ? ? ?  ?Luna Fuse, MD ?09/28/21 1448 ? ?

## 2021-09-28 NOTE — Discharge Instructions (Signed)
Go to the ophthalmologist office today. ?

## 2021-09-28 NOTE — ED Notes (Signed)
Patient called x3 no response 

## 2021-09-28 NOTE — ED Triage Notes (Signed)
Patient coming from home, states approx 24 hours ago noticed he lost approx 30% of vision in his left eye.  ?

## 2021-09-28 NOTE — ED Notes (Addendum)
Pt right eye vision 10/20 ?Pt left side eye vision 10/10 ? ?He said his left eye peripheral he sees orange and black. He has moments where if feel like a duster with filament brushes by.  ?

## 2021-09-28 NOTE — ED Provider Triage Note (Signed)
Emergency Medicine Provider Triage Evaluation Note ? ?Ronnie Carey , a 66 y.o. male  was evaluated in triage.  Pt complains of visual disturbance to left eye.  States that approximately 24 hours prior he had a sudden change in his vision.  States that his left lower visual field is gray and blurry.  Patient states that his vision has been constant since this initial change.  He has temporary relief of symptoms if he "pushes," near his left eye. ? ?Denies any numbness, weakness, facial asymmetry, dysarthria, headache, eye pain, eye discharge.  Patient does not use any contacts. ? ?Review of Systems  ?Positive: Visual disturbance ?Negative: See above ? ?Physical Exam  ?BP 126/78 (BP Location: Right Arm)   Pulse 72   Temp 98 ?F (36.7 ?C)   Resp 17   SpO2 100%  ?Gen:   Awake, no distress   ?Resp:  Normal effort  ?MSK:   Moves extremities without difficulty  ?Other:  EOM intact bilaterally with horizontal nystagmus.  Pupils PERRL.  CN II through XII intact.  Patient was a limbs equally without difficulty.  No dysarthria. ? ?Medical Decision Making  ?Medically screening exam initiated at 12:06 PM.  Appropriate orders placed.  Akiva Brassfield was informed that the remainder of the evaluation will be completed by another provider, this initial triage assessment does not replace that evaluation, and the importance of remaining in the ED until their evaluation is complete. ? ? ?  ?Haskel Schroeder, PA-C ?09/28/21 1208 ? ?

## 2021-09-29 DIAGNOSIS — H47012 Ischemic optic neuropathy, left eye: Secondary | ICD-10-CM | POA: Diagnosis not present

## 2021-09-29 DIAGNOSIS — H2513 Age-related nuclear cataract, bilateral: Secondary | ICD-10-CM | POA: Diagnosis not present

## 2021-09-29 DIAGNOSIS — E119 Type 2 diabetes mellitus without complications: Secondary | ICD-10-CM | POA: Diagnosis not present

## 2021-09-29 DIAGNOSIS — G463 Brain stem stroke syndrome: Secondary | ICD-10-CM | POA: Diagnosis not present

## 2021-10-09 NOTE — Progress Notes (Addendum)
? ? ?Patient ID: Ronnie Carey, male    DOB: April 29, 1956  MRN: [id number] ? ?CC: Preoperative Clearance ? ?Subjective: ?Ronnie Carey is a 66 y.o. male who presents for preoperative clearance.  ? ?His concerns today include:  ?Right elbow capsular release surgery appointment pending with [facility]. Patient recently seen at the [facility] Emergency Department on 09/28/2021 for chief complaint unrelated to preoperative clearance. All labs and EKG were obtained at that time excluding hemoglobin A1c. Most recent hemoglobin A1c 8.1% on 08/26/2021.  Goal for surgery 7.8% per Orthopedics. Patient previously referred to Endocrinology [facility]). However, has not been scheduled for an appointment as of present. ? ?Reports back discomfort after recently crawling underneath home for maintenance. Wants to make sure no bruising. Primary provider assured patient no evidence of bruising and to follow-up as needed.  ? ?Patient Active Problem List  ? Diagnosis Date Noted  ? Dyslipidemia (high LDL; low HDL) 07/22/2021  ? Hyperlipidemia, unspecified 07/22/2021  ? Joint pain 07/22/2021  ? Low back pain 07/22/2021  ? Obesity 07/22/2021  ? Osteoarthritis of multiple joints 07/22/2021  ? 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 Gastro-esophageal reflux disease without esophagitis 02/02/2019  ? History of pulmonary embolism 02/02/2019  ? Osteoarthritis of left knee 02/02/2019  ? Personal history of DVT (deep vein thrombosis) 01/10/2019  ? Unilateral primary osteoarthritis, left knee 08/29/2018  ? Incarcerated umbilical hernia 24/23/5361  ? Osteoarthritis 06/07/2008  ?  ? ?Current Outpatient Medications on File Prior to Visit  ?Medication Sig Dispense Refill  ? apixaban (ELIQUIS) 2.5 MG TABS tablet Take 1 tablet (2.5 mg total) by mouth 2 (two) times daily. 60 tablet 6  ? Blood Glucose Monitoring Suppl (TRUE METRIX METER)  w/Device KIT Use as directed 1 kit 0  ? glucose blood (TRUE METRIX BLOOD GLUCOSE TEST) test strip Use as instructed 100 each 12  ? metFORMIN (GLUCOPHAGE) 500 MG tablet Take 1 tablet (500 mg total) by mouth 2 (two) times daily with a meal. 180 tablet 0  ? triamcinolone ointment (KENALOG) 0.5 % Apply 1 application topically 2 (two) times daily. 80 g 0  ? TRUEplus Lancets 28G MISC Use as directed 100 each 4  ? ?No current facility-administered medications on file prior to visit.  ? ? ?No Known Allergies ? ?Social History  ? ?Socioeconomic History  ? Marital status: Single  ?  Spouse name: Not on file  ? Number of children: Not on file  ? Years of education: Not on file  ? Highest education level: Not on file  ?Occupational History  ? Not on file  ?Tobacco Use  ? Smoking status: Never  ? Smokeless tobacco: Never  ?Vaping Use  ? Vaping Use: Never used  ?Substance and Sexual Activity  ? Alcohol use: Never  ? Drug use: Never  ? Sexual activity: Not Currently  ?Other Topics Concern  ? Not on file  ?Social History Narrative  ? Not on file  ? ?Social Determinants of Health  ? ?Financial Resource Strain: Not on file  ?Food Insecurity: Not on file  ?Transportation Needs: Not on file  ?Physical Activity: Not on file  ?Stress: Not on file  ?Social Connections: Not on file  ?Intimate Partner Violence: Not on file  ? ? ?Family History  ?Problem Relation Age of Onset  ? Stroke Mother   ?     At age of 20, history of smoking  ? ? ?  Past Surgical History:  ?Procedure Laterality Date  ? TONSILLECTOMY    ? TOTAL KNEE ARTHROPLASTY Right 2012  ? ? ?ROS: ?Review of Systems ?Negative except as stated above ? ?PHYSICAL EXAM: ?BP 111/71 (BP Location: Left Arm, Patient Position: Sitting, Cuff Size: Large)   Pulse 72   Temp 98.3 ?F (36.8 ?C)   Resp 18   Ht 5' 10.98" (1.803 m)   Wt 235 lb (106.6 kg)   SpO2 95%   BMI 32.79 kg/m?  ? ?Physical Exam ?HENT:  ?   Head: Normocephalic and atraumatic.  ?Eyes:  ?   Extraocular Movements: Extraocular  movements intact.  ?   Conjunctiva/sclera: Conjunctivae normal.  ?   Pupils: Pupils are equal, round, and reactive to light.  ?Cardiovascular:  ?   Rate and Rhythm: Normal rate and regular rhythm.  ?   Pulses: Normal pulses.  ?   Heart sounds: Normal heart sounds.  ?Pulmonary:  ?   Effort: Pulmonary effort is normal.  ?   Breath sounds: Normal breath sounds.  ?Musculoskeletal:  ?   Cervical back: Normal range of motion and neck supple.  ?Skin: ?   General: Skin is warm and dry.  ?Neurological:  ?   General: No focal deficit present.  ?   Mental Status: He is alert and oriented to person, place, and time.  ?Psychiatric:     ?   Mood and Affect: Mood normal.     ?   Behavior: Behavior normal.  ? ?ASSESSMENT AND PLAN: ?1. Preoperative clearance: ?- Right elbow capsular release surgery appointment pending with [facility]. Patient recently seen at the [facility] Emergency Department on 09/28/2021 for chief complaint unrelated to preoperative clearance. All labs and EKG were obtained at that time excluding hemoglobin A1c. Most recent hemoglobin A1c 8.1% on 08/26/2021. Patient previously referred to Endocrinology [facility]). However, has not been scheduled for an appointment as of present. ?- Primary provider will consult with referral coordinator to get patient an appointment with Endocrinology soon.  ? ? ?Patient was given the opportunity to ask questions.  Patient verbalized understanding of the plan and was able to repeat key elements of the plan. Patient was given clear instructions to go to Emergency Department or return to medical center if symptoms don't improve, worsen, or new problems develop.The patient verbalized understanding. ? ?Follow-up with primary provider as scheduled.  ? ?Camillia Herter, NP  ?

## 2021-10-13 ENCOUNTER — Encounter: Payer: Self-pay | Admitting: Family

## 2021-10-13 ENCOUNTER — Ambulatory Visit (INDEPENDENT_AMBULATORY_CARE_PROVIDER_SITE_OTHER): Payer: Medicare HMO | Admitting: Family

## 2021-10-13 ENCOUNTER — Telehealth: Payer: Self-pay | Admitting: Family

## 2021-10-13 VITALS — BP 111/71 | HR 72 | Temp 98.3°F | Resp 18 | Ht 70.98 in | Wt 235.0 lb

## 2021-10-13 DIAGNOSIS — Z01818 Encounter for other preprocedural examination: Secondary | ICD-10-CM

## 2021-10-13 NOTE — Progress Notes (Signed)
Pt presents for pre-op clearance for right elbow capsular release w/[facility]  ?Needed labs and EKG for surgery clearance completed 09/28/21 when pt presented to ED  ?

## 2021-10-14 DIAGNOSIS — G453 Amaurosis fugax: Secondary | ICD-10-CM | POA: Diagnosis not present

## 2021-10-15 NOTE — Telephone Encounter (Signed)
Thank you Nora

## 2021-10-20 ENCOUNTER — Encounter: Payer: Self-pay | Admitting: Family

## 2021-10-21 ENCOUNTER — Ambulatory Visit: Payer: Medicare HMO | Admitting: Family

## 2021-11-13 ENCOUNTER — Other Ambulatory Visit: Payer: Self-pay | Admitting: Family

## 2021-11-13 DIAGNOSIS — E119 Type 2 diabetes mellitus without complications: Secondary | ICD-10-CM

## 2021-11-13 NOTE — Telephone Encounter (Signed)
Refilled per patient request. 

## 2021-11-30 DIAGNOSIS — E1165 Type 2 diabetes mellitus with hyperglycemia: Secondary | ICD-10-CM | POA: Diagnosis not present

## 2021-11-30 DIAGNOSIS — E669 Obesity, unspecified: Secondary | ICD-10-CM | POA: Diagnosis not present

## 2021-11-30 DIAGNOSIS — E7800 Pure hypercholesterolemia, unspecified: Secondary | ICD-10-CM | POA: Diagnosis not present

## 2021-11-30 DIAGNOSIS — I2699 Other pulmonary embolism without acute cor pulmonale: Secondary | ICD-10-CM | POA: Diagnosis not present

## 2021-11-30 DIAGNOSIS — I700 Atherosclerosis of aorta: Secondary | ICD-10-CM | POA: Diagnosis not present

## 2021-12-22 ENCOUNTER — Encounter: Payer: Self-pay | Admitting: Family

## 2021-12-23 ENCOUNTER — Telehealth: Payer: Self-pay | Admitting: Family

## 2021-12-23 DIAGNOSIS — E1165 Type 2 diabetes mellitus with hyperglycemia: Secondary | ICD-10-CM | POA: Insufficient documentation

## 2021-12-23 DIAGNOSIS — I700 Atherosclerosis of aorta: Secondary | ICD-10-CM | POA: Insufficient documentation

## 2021-12-23 DIAGNOSIS — E7800 Pure hypercholesterolemia, unspecified: Secondary | ICD-10-CM | POA: Insufficient documentation

## 2021-12-23 NOTE — Telephone Encounter (Signed)
Spoke to pt advised to have Talmage Nap, Md send office visit notes over to PCP to confirm A1c results and have orthopedic send surgical clearance form to our office.

## 2021-12-23 NOTE — Telephone Encounter (Signed)
Copied from CRM [id number]. Topic: General - Other >> Dec 23, 2021 12:01 PM Esperanza Sheets wrote: Patient states endocrinology office will send results as requested, electronically today 12-23-2021

## 2022-01-13 DIAGNOSIS — G453 Amaurosis fugax: Secondary | ICD-10-CM | POA: Diagnosis not present

## 2022-01-14 ENCOUNTER — Telehealth: Payer: Self-pay | Admitting: Family

## 2022-01-14 NOTE — Telephone Encounter (Signed)
Ronnie Carey from [facility] checking in w/  inquiring about surgical clearance paperwork states was  faxed 3/21, 4/21,5/25 for right elbow capsular release. Faxed again today and placed in provider bin. Ronnie Carey's direct call back number is [phone]-, requested fax back to [phone])

## 2022-01-14 NOTE — Telephone Encounter (Signed)
Form will be faxed on 08/11 after provider signature

## 2022-01-18 ENCOUNTER — Telehealth: Payer: Self-pay | Admitting: *Deleted

## 2022-01-18 NOTE — Telephone Encounter (Signed)
Received call from [facility]. They are in need of medical clearance for pt to have orthopedic procedure. Provided Fax # of [phone] so she can fax the release to thsi office. We will fax back after completed.

## 2022-01-29 ENCOUNTER — Other Ambulatory Visit: Payer: Self-pay | Admitting: Ophthalmology

## 2022-01-29 DIAGNOSIS — G453 Amaurosis fugax: Secondary | ICD-10-CM

## 2022-02-04 ENCOUNTER — Other Ambulatory Visit: Payer: Self-pay | Admitting: Family

## 2022-02-04 DIAGNOSIS — E119 Type 2 diabetes mellitus without complications: Secondary | ICD-10-CM

## 2022-02-12 ENCOUNTER — Other Ambulatory Visit: Payer: Self-pay | Admitting: Family

## 2022-02-12 DIAGNOSIS — E119 Type 2 diabetes mellitus without complications: Secondary | ICD-10-CM

## 2022-02-13 ENCOUNTER — Ambulatory Visit
Admission: RE | Admit: 2022-02-13 | Discharge: 2022-02-13 | Disposition: A | Discharge status: Home / Self Care | Payer: Medicare HMO | Source: Ambulatory Visit | Attending: Ophthalmology | Admitting: Ophthalmology

## 2022-02-13 DIAGNOSIS — G453 Amaurosis fugax: Secondary | ICD-10-CM

## 2022-02-13 DIAGNOSIS — I639 Cerebral infarction, unspecified: Secondary | ICD-10-CM | POA: Diagnosis not present

## 2022-02-13 DIAGNOSIS — H53132 Sudden visual loss, left eye: Secondary | ICD-10-CM | POA: Diagnosis not present

## 2022-02-13 DIAGNOSIS — J3489 Other specified disorders of nose and nasal sinuses: Secondary | ICD-10-CM | POA: Diagnosis not present

## 2022-02-13 MED ORDER — GADOBENATE DIMEGLUMINE 529 MG/ML IV SOLN
20.0000 mL | Freq: Once | INTRAVENOUS | Status: AC | PRN
  Administered 2022-02-13: 20 mL via INTRAVENOUS

## 2022-02-14 ENCOUNTER — Other Ambulatory Visit: Payer: Self-pay | Admitting: Family

## 2022-02-14 DIAGNOSIS — E119 Type 2 diabetes mellitus without complications: Secondary | ICD-10-CM

## 2022-02-23 ENCOUNTER — Encounter: Payer: Self-pay | Admitting: Family

## 2022-02-23 NOTE — Telephone Encounter (Signed)
Called pt and LM on VM to call Butch Penny back. I see a referral to Dermatology back in Jan, 2023 but it doesn't appear that the patient went.

## 2022-02-26 ENCOUNTER — Other Ambulatory Visit: Payer: Self-pay | Admitting: Family

## 2022-02-26 ENCOUNTER — Other Ambulatory Visit: Payer: Self-pay

## 2022-02-26 ENCOUNTER — Encounter: Payer: Self-pay | Admitting: Physician Assistant

## 2022-02-26 DIAGNOSIS — I825Z3 Chronic embolism and thrombosis of unspecified deep veins of distal lower extremity, bilateral: Secondary | ICD-10-CM

## 2022-02-26 DIAGNOSIS — L258 Unspecified contact dermatitis due to other agents: Secondary | ICD-10-CM | POA: Diagnosis not present

## 2022-02-26 DIAGNOSIS — E119 Type 2 diabetes mellitus without complications: Secondary | ICD-10-CM

## 2022-02-26 MED ORDER — APIXABAN 2.5 MG PO TABS: 2.5000 mg | ORAL_TABLET | Freq: Two times a day (BID) | ORAL | 6 refills | Status: DC

## 2022-03-02 DIAGNOSIS — M9904 Segmental and somatic dysfunction of sacral region: Secondary | ICD-10-CM | POA: Diagnosis not present

## 2022-03-02 DIAGNOSIS — M9905 Segmental and somatic dysfunction of pelvic region: Secondary | ICD-10-CM | POA: Diagnosis not present

## 2022-03-02 DIAGNOSIS — M9903 Segmental and somatic dysfunction of lumbar region: Secondary | ICD-10-CM | POA: Diagnosis not present

## 2022-03-02 DIAGNOSIS — M5136 Other intervertebral disc degeneration, lumbar region: Secondary | ICD-10-CM | POA: Diagnosis not present

## 2022-03-16 ENCOUNTER — Encounter: Payer: Self-pay | Admitting: Family

## 2022-03-17 DIAGNOSIS — H47012 Ischemic optic neuropathy, left eye: Secondary | ICD-10-CM | POA: Diagnosis not present

## 2022-03-18 ENCOUNTER — Other Ambulatory Visit: Payer: Self-pay | Admitting: Hematology and Oncology

## 2022-03-18 ENCOUNTER — Inpatient Hospital Stay (HOSPITAL_BASED_OUTPATIENT_CLINIC_OR_DEPARTMENT_OTHER): Payer: Medicare Other | Admitting: Hematology and Oncology

## 2022-03-18 ENCOUNTER — Inpatient Hospital Stay: Payer: Medicare Other | Attending: Hematology and Oncology

## 2022-03-18 VITALS — BP 117/85 | HR 82 | Temp 98.1°F | Resp 20 | Wt 230.2 lb

## 2022-03-18 DIAGNOSIS — R072 Precordial pain: Secondary | ICD-10-CM | POA: Diagnosis not present

## 2022-03-18 DIAGNOSIS — Z86718 Personal history of other venous thrombosis and embolism: Secondary | ICD-10-CM | POA: Diagnosis not present

## 2022-03-18 DIAGNOSIS — I639 Cerebral infarction, unspecified: Secondary | ICD-10-CM

## 2022-03-18 DIAGNOSIS — Z79899 Other long term (current) drug therapy: Secondary | ICD-10-CM | POA: Insufficient documentation

## 2022-03-18 DIAGNOSIS — Z823 Family history of stroke: Secondary | ICD-10-CM | POA: Insufficient documentation

## 2022-03-18 DIAGNOSIS — Z7901 Long term (current) use of anticoagulants: Secondary | ICD-10-CM | POA: Diagnosis not present

## 2022-03-18 DIAGNOSIS — I2692 Saddle embolus of pulmonary artery without acute cor pulmonale: Secondary | ICD-10-CM

## 2022-03-18 DIAGNOSIS — R0602 Shortness of breath: Secondary | ICD-10-CM | POA: Diagnosis not present

## 2022-03-18 DIAGNOSIS — Z86711 Personal history of pulmonary embolism: Secondary | ICD-10-CM | POA: Insufficient documentation

## 2022-03-18 DIAGNOSIS — I825Z3 Chronic embolism and thrombosis of unspecified deep veins of distal lower extremity, bilateral: Secondary | ICD-10-CM | POA: Diagnosis not present

## 2022-03-18 LAB — CBC WITH DIFFERENTIAL (CANCER CENTER ONLY)
Abs Immature Granulocytes: 0.01 10*3/uL (ref 0.00–0.07)
Basophils Absolute: 0 10*3/uL (ref 0.0–0.1)
Basophils Relative: 1 %
Eosinophils Absolute: 0.4 10*3/uL (ref 0.0–0.5)
Eosinophils Relative: 7 %
HCT: 41.8 % (ref 39.0–52.0)
Hemoglobin: 14.9 g/dL (ref 13.0–17.0)
Immature Granulocytes: 0 %
Lymphocytes Relative: 27 %
Lymphs Abs: 1.5 10*3/uL (ref 0.7–4.0)
MCH: 30.7 pg (ref 26.0–34.0)
MCHC: 35.6 g/dL (ref 30.0–36.0)
MCV: 86 fL (ref 80.0–100.0)
Monocytes Absolute: 0.5 10*3/uL (ref 0.1–1.0)
Monocytes Relative: 9 %
Neutro Abs: 3.1 10*3/uL (ref 1.7–7.7)
Neutrophils Relative %: 56 %
Platelet Count: 278 10*3/uL (ref 150–400)
RBC: 4.86 MIL/uL (ref 4.22–5.81)
RDW: 12.3 % (ref 11.5–15.5)
WBC Count: 5.5 10*3/uL (ref 4.0–10.5)
nRBC: 0 % (ref 0.0–0.2)

## 2022-03-18 LAB — CMP (CANCER CENTER ONLY)
ALT: 25 U/L (ref 0–44)
AST: 17 U/L (ref 15–41)
Albumin: 4.6 g/dL (ref 3.5–5.0)
Alkaline Phosphatase: 63 U/L (ref 38–126)
Anion gap: 8 (ref 5–15)
BUN: 16 mg/dL (ref 8–23)
CO2: 25 mmol/L (ref 22–32)
Calcium: 9.5 mg/dL (ref 8.9–10.3)
Chloride: 104 mmol/L (ref 98–111)
Creatinine: 1.08 mg/dL (ref 0.61–1.24)
GFR, Estimated: >60 mL/min (ref >60)
Glucose, Bld: 210 mg/dL — ABNORMAL HIGH (ref 70–99)
Potassium: 4.2 mmol/L (ref 3.5–5.1)
Sodium: 137 mmol/L (ref 135–145)
Total Bilirubin: 0.7 mg/dL (ref 0.3–1.2)
Total Protein: 7.5 g/dL (ref 6.5–8.1)

## 2022-03-18 NOTE — Progress Notes (Signed)
[place] Telephone:([phone]) [phone]   [phone]  PROGRESS NOTE  Patient Care Team: Camillia Herter, NP as PCP - General (Nurse Practitioner)  Hematological/Oncological History  # Recurrent DVT and PE 1) 10/01/2010: After knee replacement, patient developed lower extremity swelling and chest pain.  -Doppler US: Extensive left lower extremity DVT involving the inferior common femoral vein and popliteal vein.  -CT chest: acute pulmonary embolism involving a branch of the right lower lobe pulmonary artery.  -Completed course of anticoagulation with improvement of symptoms.   2) 10/13/2020-10/16/2020: Admitted for acute onset shortness of breath and substernal chest pain.  -CTA chest: Significant bilateral pulmonary emboli with evidence of saddle component centrally. Elevated RV/LV ratio is noted of 3.Patchy airspace opacity in the medial aspect of the left upper lobe which may represent edema or early infiltrate. -Doppler US:Findings consistent with acute deep vein thrombosis involving the right femoral vein, right popliteal vein,right posterior tibial veins, right peroneal veins, and right gastrocnemius veins. DVT in the popliteal vein is mobile. Findings consistent with age indeterminate deep vein thrombosis involving the left common femoral vein, SF junction, left femoral vein, left proximal profunda vein, left popliteal vein, left posterior tibial veins, and left gastrocnemius veins. -Echo: Moderately reduced right ventricular function secondary to PE -Initially treated with heparin infusion and then transitioned to Eliquis.  3) 03/17/2021: Establish care with Dede Query PA-C   4) 09/16/2021: Transitioned to maintenance dose of Eliquis 2.5 mg twice daily.    HISTORY OF PRESENTING ILLNESS:  Ronnie Carey 66 y.o. 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 He is unaccompanied for this visit.   At today's visit, Mr.  Carey reports he was disappointed by his elevated blood sugar recently and decided to walk here.  He walked approximately 5 miles and reports it was quite a nice walk.  He thinks his sugar may have been elevated because he ate rice.  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  Further recommendations ophthalmologist a carotid artery ultrasound should be ordered.  Recently switched to metformin 500 mg extended release once daily.  He also has a skin lesion in his right elbow crease which is concerning for some form of infection.  He has been treated with permethrin and this has been effective thus far.  He reports he is tolerating Eliquis therapy well with no bleeding, bruising, or dark stools.  He reports that he was working on his bicycle and cut his arm and "bled spectacularly".  He was able to control the bleeding without having to require any assistance or hospitalization.  He does not currently have any signs or symptoms concerning for recurrent VTE such as leg swelling, leg pain, chest pain, shortness of breath.  He denies fevers, chills, night sweats, shortness of breath, chest pain or cough. He has no other complaints. Rest of the 10 point ROS is below.   MEDICAL HISTORY:  Past Medical History:  Diagnosis Date   History of pulmonary embolus (PE)    SBO (small bowel obstruction) ([place]) 05/2018    SURGICAL HISTORY: Past Surgical History:  Procedure Laterality Date   TONSILLECTOMY     TOTAL KNEE ARTHROPLASTY Right 2012    SOCIAL HISTORY: Social History   Socioeconomic History   Marital status: Single    Spouse name: Not on file   Number of children: Not on file   Years of education: Not  on file   Highest education level: Not on file  Occupational History   Not on file  Tobacco Use   Smoking status: Never   Smokeless tobacco: Never  Vaping Use   Vaping Use: Never used  Substance and Sexual Activity   Alcohol use: Never   Drug use:  Never   Sexual activity: Not Currently  Other Topics Concern   Not on file  Social History Narrative   Not on file   Social Determinants of Health   Financial Resource Strain: Not on file  Food Insecurity: Not on file  Transportation Needs: Not on file  Physical Activity: Not on file  Stress: Not on file  Social Connections: Not on file  Intimate Partner Violence: Not on file    FAMILY HISTORY: Family History  Problem Relation Age of Onset   Stroke Mother        At age of 23, history of smoking    ALLERGIES:  has No Known Allergies.  MEDICATIONS:  Current Outpatient Medications  Medication Sig Dispense Refill   apixaban (ELIQUIS) 2.5 MG TABS tablet Take 1 tablet (2.5 mg total) by mouth 2 (two) times daily. 60 tablet 6   Blood Glucose Monitoring Suppl (TRUE METRIX METER) w/Device KIT Use as directed 1 kit 0   glucose blood (TRUE METRIX BLOOD GLUCOSE TEST) test strip Use as instructed 100 each 12   metFORMIN (GLUCOPHAGE) 500 MG tablet TAKE 1 TABLET BY MOUTH 2 TIMES DAILY WITH A MEAL. 180 tablet 0   triamcinolone ointment (KENALOG) 0.5 % Apply 1 application topically 2 (two) times daily. 80 g 0   TRUEplus Lancets 28G MISC Use as directed 100 each 4   No current facility-administered medications for this visit.    REVIEW OF SYSTEMS:   Constitutional: ( - ) fevers, ( - )  chills , ( - ) night sweats Eyes: ( - ) blurriness of vision, ( - ) double vision, ( - ) watery eyes Ears, nose, mouth, throat, and face: ( - ) mucositis, ( - ) sore throat Respiratory: ( - ) cough, ( - ) dyspnea, ( - ) wheezes Cardiovascular: ( - ) palpitation, ( - ) chest discomfort, ( - ) lower extremity swelling Gastrointestinal:  ( - ) nausea, ( - ) heartburn, ( - ) change in bowel habits Skin: ( - ) abnormal skin rashes Lymphatics: ( - ) new lymphadenopathy, ( - ) easy bruising Neurological: ( - ) numbness, ( - ) tingling, ( - ) new weaknesses Behavioral/Psych: ( - ) mood change, ( - ) new changes   All other systems were reviewed with the patient and are negative.  PHYSICAL EXAMINATION: ECOG PERFORMANCE STATUS: 0 - Asymptomatic  Vitals:   03/18/22 1105  BP: 117/85  Pulse: 82  Resp: 20  Temp: 98.1 F (36.7 C)  SpO2: 96%   Filed Weights   03/18/22 1105  Weight: 230 lb 3.2 oz (104.4 kg)    GENERAL: well appearing male in NAD  SKIN: skin color, texture, turgor are normal, no rashes or significant lesions EYES: conjunctiva are pink and non-injected, sclera clear LUNGS: clear to auscultation and percussion with normal breathing effort HEART: regular rate & rhythm and no murmurs. No lower extremity edema.  Musculoskeletal: no cyanosis of digits and no clubbing  PSYCH: alert & oriented x 3, fluent speech NEURO: no focal motor/sensory deficits  LABORATORY DATA:  I have reviewed the data as listed    Latest Ref Rng & Units 03/18/2022  10:25 AM 09/28/2021   12:39 PM 09/28/2021   12:01 PM  CBC  WBC 4.0 - 10.5 K/uL 5.5   6.6   Hemoglobin 13.0 - 17.0 g/dL 14.9  14.6  14.8   Hematocrit 39.0 - 52.0 % 41.8  43.0  43.1   Platelets 150 - 400 K/uL 278   293        Latest Ref Rng & Units 03/18/2022   10:25 AM 09/28/2021   12:39 PM 09/28/2021   12:01 PM  CMP  Glucose 70 - 99 mg/dL 210  145  152   BUN 8 - 23 mg/dL _0 Creatinine 0.61 - 1.24 mg/dL 1.08  1.00  1.11   Sodium 135 - 145 mmol/L 137  138  138   Potassium 3.5 - 5.1 mmol/L 4.2  3.8  3.9   Chloride 98 - 111 mmol/L 104  104  103   CO2 22 - 32 mmol/L 25   27   Calcium 8.9 - 10.3 mg/dL 9.5   9.1   Total Protein 6.5 - 8.1 g/dL 7.5   7.1   Total Bilirubin 0.3 - 1.2 mg/dL 0.7   0.5   Alkaline Phos 38 - 126 U/L 63   60   AST 15 - 41 U/L 17   20   ALT 0 - 44 U/L 25   25     RADIOGRAPHIC STUDIES: I have personally reviewed the radiological images as listed and agreed with the findings in the report. No results found.  ASSESSMENT & PLAN Egan Berkheimer is a 66 y.o. male returns for a follow up for   recurrent DVT/PE.   #Recurrent PE and DVT: --First episode was in April 2012 following knee replacement.  --Second and most recent episode in May 2022, no definitive provoking factor.  --Workup from 03/17/2021 showed no evidence of antiphospholipid syndrome. --Recommend indefinite anticoagulation due to recurrent VTEs and most recent episode that involved a saddle embolism without a clear provoking factor.  Plan: --Labs today were reviewed and require no intervention. WBC 5.5, Hgb 14.9, Plt 278 --Currently taking Eliquis 2.5 mg twice daily.   --RTC in 6 months with repeat labs.   No orders of the defined types were placed in this encounter.   All questions were answered. The patient knows to call the clinic with any problems, questions or concerns.  I have spent a total of 30 minutes minutes of face-to-face and non-face-to-face time, preparing to see the patient, performing a medically appropriate examination, counseling and educating the patient, ordering meds, documenting clinical information in the electronic health record,  and care coordination.   Ledell Peoples, MD Department of Hematology/Oncology [place] [facility] Phone: [phone] Pager: [phone] Email: [email or web address]

## 2022-03-19 ENCOUNTER — Telehealth: Payer: Self-pay | Admitting: Physician Assistant

## 2022-03-19 NOTE — Telephone Encounter (Signed)
Per 10/12 los called and left message for pt about appointment

## 2022-03-26 ENCOUNTER — Other Ambulatory Visit: Payer: Self-pay | Admitting: *Deleted

## 2022-03-26 DIAGNOSIS — M25521 Pain in right elbow: Secondary | ICD-10-CM | POA: Diagnosis not present

## 2022-03-26 DIAGNOSIS — I639 Cerebral infarction, unspecified: Secondary | ICD-10-CM

## 2022-04-01 ENCOUNTER — Encounter (HOSPITAL_BASED_OUTPATIENT_CLINIC_OR_DEPARTMENT_OTHER): Admission: RE | Payer: Self-pay | Source: Home / Self Care

## 2022-04-01 ENCOUNTER — Ambulatory Visit (HOSPITAL_BASED_OUTPATIENT_CLINIC_OR_DEPARTMENT_OTHER): Admission: RE | Admit: 2022-04-01 | Payer: Medicare Other | Source: Home / Self Care | Admitting: Orthopaedic Surgery

## 2022-04-01 SURGERY — CAPSULAR RELEASE
Anesthesia: Choice | Site: Elbow | Laterality: Right

## 2022-04-08 ENCOUNTER — Ambulatory Visit (HOSPITAL_COMMUNITY)
Admission: RE | Admit: 2022-04-08 | Discharge: 2022-04-08 | Disposition: A | Discharge status: Home / Self Care | Payer: Medicare Other | Source: Ambulatory Visit | Attending: Hematology and Oncology | Admitting: Hematology and Oncology

## 2022-04-08 DIAGNOSIS — I639 Cerebral infarction, unspecified: Secondary | ICD-10-CM | POA: Diagnosis not present

## 2022-04-16 ENCOUNTER — Telehealth: Payer: Self-pay | Admitting: *Deleted

## 2022-04-16 NOTE — Telephone Encounter (Signed)
TCT patient regarding recent carotid US. No answer. LVM to call back

## 2022-04-16 NOTE — Telephone Encounter (Signed)
-----   Message from Jaci Standard, MD sent at 04/08/2022 10:31 AM EDT ----- Please provide Ronnie Carey with the results of his carotid ultrasound test.  ----- Message ----- From: [employer], [employer] Sent: 04/08/2022  10:14 AM EDT To: Jaci Standard, MD

## 2022-04-19 NOTE — Telephone Encounter (Signed)
Received call back from pt. Advised that that his carotid US that revealed 1-39% stenosis. Dr. Leonides Schanz stated that there was no recommended treatment. Advised to let his PCP know of these results as well as his ophthalmologist. Pt voiced understanding.

## 2022-05-06 ENCOUNTER — Encounter: Payer: Self-pay | Admitting: Physician Assistant

## 2022-05-09 ENCOUNTER — Other Ambulatory Visit: Payer: Self-pay | Admitting: Physician Assistant

## 2022-05-09 DIAGNOSIS — I825Z3 Chronic embolism and thrombosis of unspecified deep veins of distal lower extremity, bilateral: Secondary | ICD-10-CM

## 2022-05-09 MED ORDER — APIXABAN 2.5 MG PO TABS: 2.5000 mg | ORAL_TABLET | Freq: Two times a day (BID) | ORAL | 6 refills | Status: AC

## 2022-05-11 ENCOUNTER — Other Ambulatory Visit: Payer: Self-pay | Admitting: Family

## 2022-05-12 DIAGNOSIS — M9905 Segmental and somatic dysfunction of pelvic region: Secondary | ICD-10-CM | POA: Diagnosis not present

## 2022-05-12 DIAGNOSIS — M5386 Other specified dorsopathies, lumbar region: Secondary | ICD-10-CM | POA: Diagnosis not present

## 2022-05-12 DIAGNOSIS — M9904 Segmental and somatic dysfunction of sacral region: Secondary | ICD-10-CM | POA: Diagnosis not present

## 2022-05-12 DIAGNOSIS — M9903 Segmental and somatic dysfunction of lumbar region: Secondary | ICD-10-CM | POA: Diagnosis not present

## 2022-05-19 DIAGNOSIS — M9904 Segmental and somatic dysfunction of sacral region: Secondary | ICD-10-CM | POA: Diagnosis not present

## 2022-05-19 DIAGNOSIS — M9903 Segmental and somatic dysfunction of lumbar region: Secondary | ICD-10-CM | POA: Diagnosis not present

## 2022-05-19 DIAGNOSIS — M5386 Other specified dorsopathies, lumbar region: Secondary | ICD-10-CM | POA: Diagnosis not present

## 2022-05-19 DIAGNOSIS — M9905 Segmental and somatic dysfunction of pelvic region: Secondary | ICD-10-CM | POA: Diagnosis not present

## 2022-05-26 DIAGNOSIS — M9904 Segmental and somatic dysfunction of sacral region: Secondary | ICD-10-CM | POA: Diagnosis not present

## 2022-05-26 DIAGNOSIS — M9905 Segmental and somatic dysfunction of pelvic region: Secondary | ICD-10-CM | POA: Diagnosis not present

## 2022-05-26 DIAGNOSIS — M5386 Other specified dorsopathies, lumbar region: Secondary | ICD-10-CM | POA: Diagnosis not present

## 2022-05-26 DIAGNOSIS — M9903 Segmental and somatic dysfunction of lumbar region: Secondary | ICD-10-CM | POA: Diagnosis not present

## 2022-06-02 DIAGNOSIS — M9904 Segmental and somatic dysfunction of sacral region: Secondary | ICD-10-CM | POA: Diagnosis not present

## 2022-06-02 DIAGNOSIS — M9905 Segmental and somatic dysfunction of pelvic region: Secondary | ICD-10-CM | POA: Diagnosis not present

## 2022-06-02 DIAGNOSIS — M5386 Other specified dorsopathies, lumbar region: Secondary | ICD-10-CM | POA: Diagnosis not present

## 2022-06-02 DIAGNOSIS — M9903 Segmental and somatic dysfunction of lumbar region: Secondary | ICD-10-CM | POA: Diagnosis not present

## 2022-07-07 IMAGING — CT CT ELBOW*R* W/O CM
1 series · 12 of 14 positions shown, 15 images · non-contrast
Comparison: None.

CLINICAL DATA: Right elbow pain and swelling. Fell on elbow in
5771. No history of surgery.

EXAM:
CT OF THE UPPER RIGHT EXTREMITY WITHOUT CONTRAST
TECHNIQUE: Multidetector CT imaging of the upper right extremity was performed
according to the standard protocol.
RADIATION DOSE REDUCTION: This exam was performed according to the
departmental dose-optimization program which includes automated
exposure control, adjustment of the mA and/or kV according to
patient size and/or use of iterative reconstruction technique.

[Series 2: ext soft · axial · 0.37mm/px · z∈[-509,-343]mm · 12 of 98 slices shown, 15 images]
[im 8/98  soft-tissue]
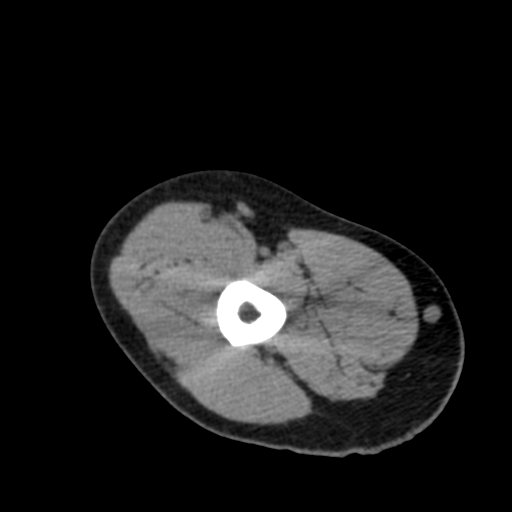
[im 8/98  bone]
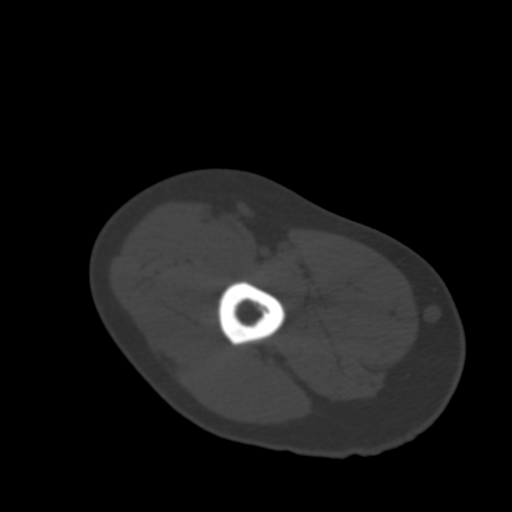
[im 15/98  bone]
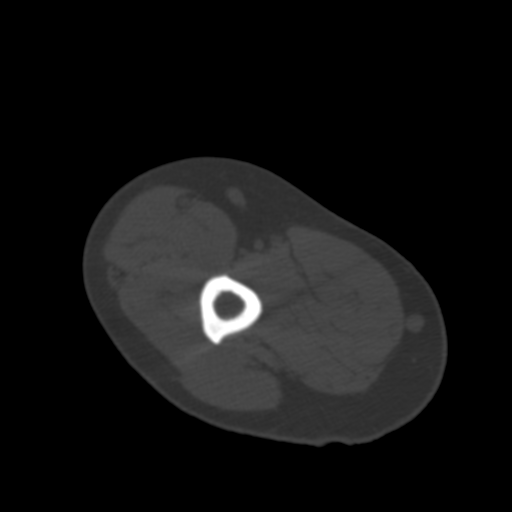
[im 23/98  bone]
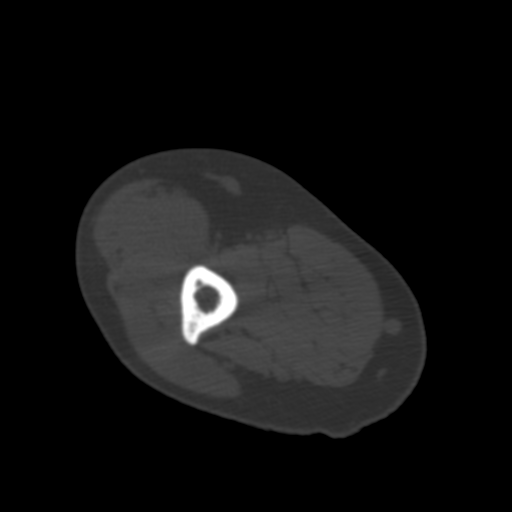
[im 30/98  bone]
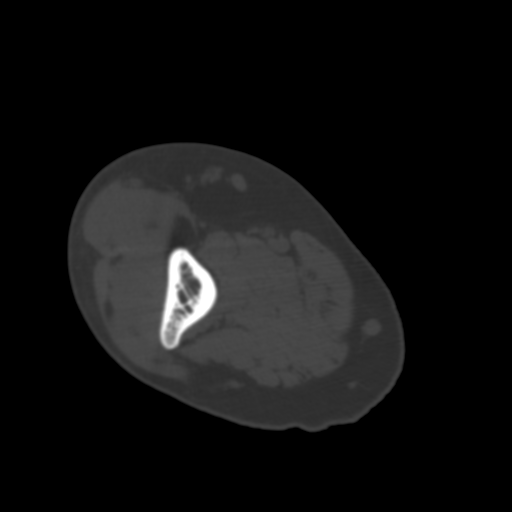
[im 38/98  soft-tissue]
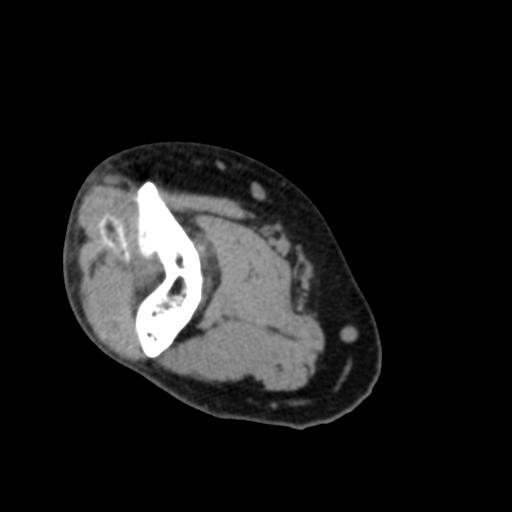
[im 38/98  bone]
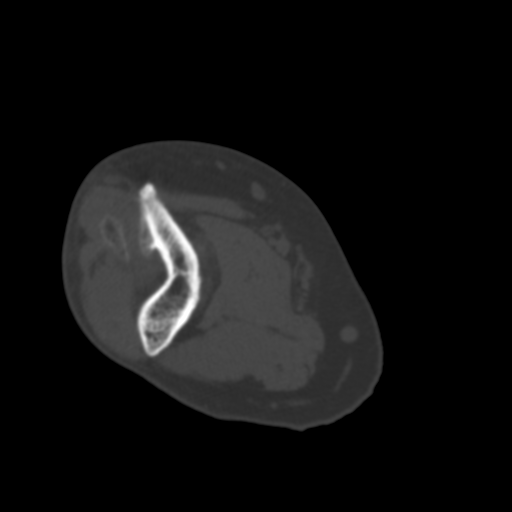
[im 45/98  bone]
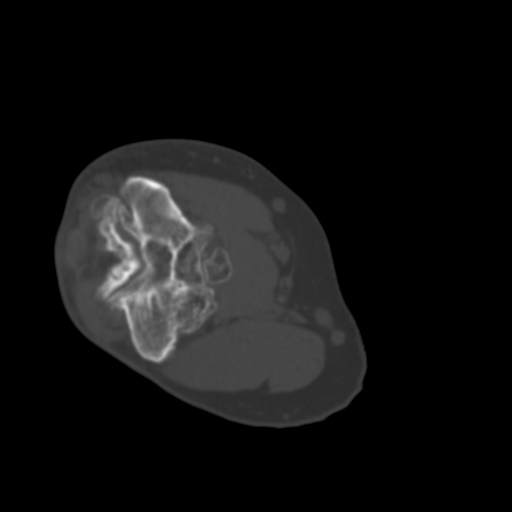
[im 53/98  bone]
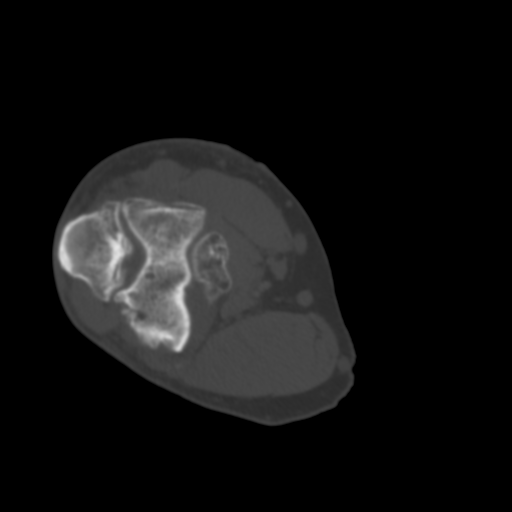
[im 60/98  bone]
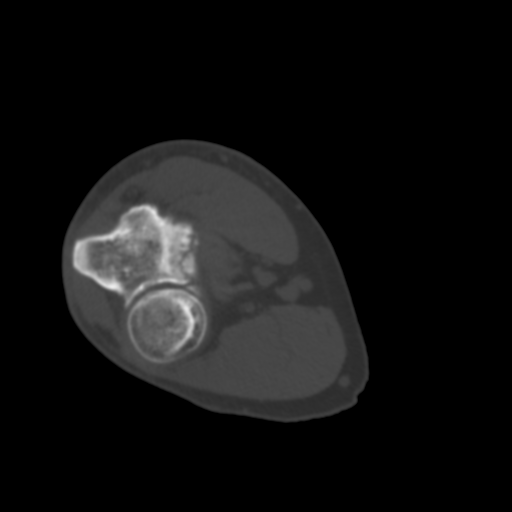
[im 68/98  soft-tissue]
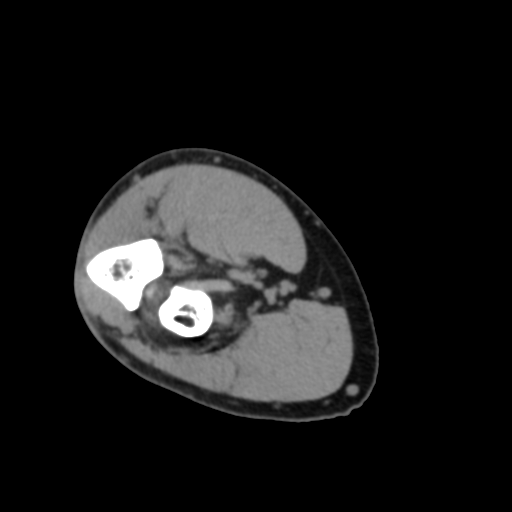
[im 68/98  bone]
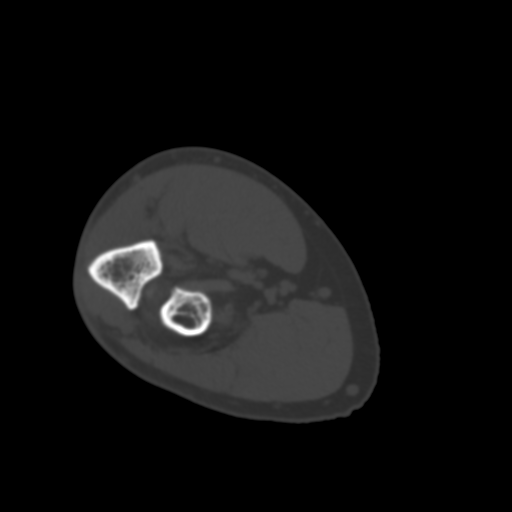
[im 75/98  bone]
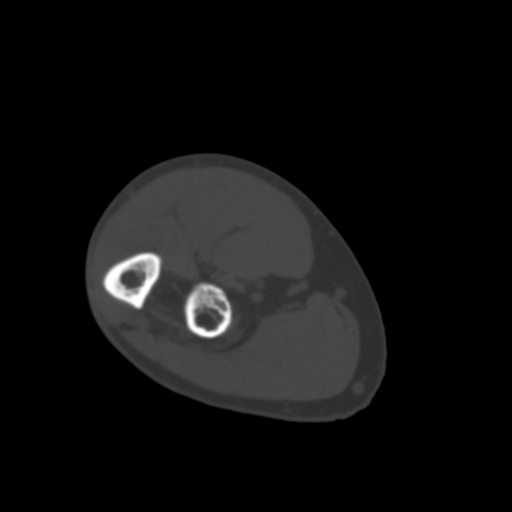
[im 83/98  bone]
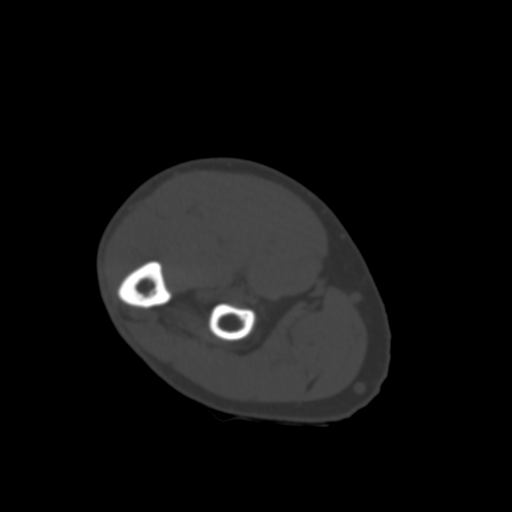
[im 90/98  bone]
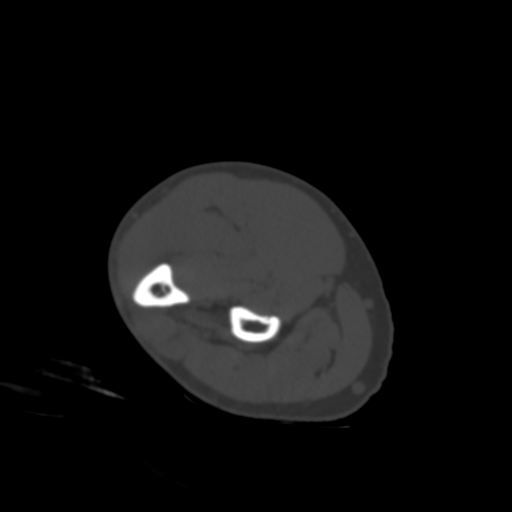

[12 of 14 positions shown; findings below may reference images not displayed]

FINDINGS: Bones/Joint/Cartilage

Severe osteoarthritis of the medial and lateral elbow including the
trochlear olecranon and capitellum radial head articulations with
severe joint space narrowing, subchondral sclerosis and peripheral
osteophytosis. There are multiple loose bodies within the joint
space just anterior to the distal humerus measuring up to 1.6 cm
(sagittal series 6, image 39). There is an additional loose body
within the posterior olecranon fossa measuring up to 2.3 cm (axial
series 2, image 59). Additional smaller loose bodies are seen medial
to the distal humerus (axial image 49) and within the joint space in
between the ventral aspect of the coronoid and radial head (coronal
image 34).

There is a large ossicle measuring up to approximately 2.6 x 1.5 x
2.6 cm (transverse by AP by craniocaudal) extending from the tip of
the coronoid process appearing to represent a chronic osteophyte
that is remotely segmented/fractured from the rest of the coronoid
process (sagittal image 36 and axial image 46).

Ligaments

Suboptimally assessed by CT.

Muscles and Tendons

Normal size and density of the elbow musculature.

Soft tissues

No joint effusion.
IMPRESSION: :
IMPRESSION: 1. Severe osteoarthritis of the elbow joint diffusely with multiple
loose bodies greatest within the distal anterior humerus coronoid
fossa and the posterior distal humerus olecranon fossa.
2. There is a large segmented osteophyte extending anteriorly from
the coronoid process, possibly an osteophyte that is remotely
fractured.

## 2022-07-08 ENCOUNTER — Other Ambulatory Visit: Payer: Self-pay | Admitting: Family

## 2022-07-08 DIAGNOSIS — E119 Type 2 diabetes mellitus without complications: Secondary | ICD-10-CM

## 2022-07-13 DIAGNOSIS — I700 Atherosclerosis of aorta: Secondary | ICD-10-CM | POA: Diagnosis not present

## 2022-07-13 DIAGNOSIS — E1165 Type 2 diabetes mellitus with hyperglycemia: Secondary | ICD-10-CM | POA: Diagnosis not present

## 2022-07-13 DIAGNOSIS — I2699 Other pulmonary embolism without acute cor pulmonale: Secondary | ICD-10-CM | POA: Diagnosis not present

## 2022-07-13 DIAGNOSIS — E7800 Pure hypercholesterolemia, unspecified: Secondary | ICD-10-CM | POA: Diagnosis not present

## 2022-07-14 DIAGNOSIS — E1165 Type 2 diabetes mellitus with hyperglycemia: Secondary | ICD-10-CM | POA: Diagnosis not present

## 2022-07-14 DIAGNOSIS — E7800 Pure hypercholesterolemia, unspecified: Secondary | ICD-10-CM | POA: Diagnosis not present

## 2022-07-29 ENCOUNTER — Telehealth: Payer: Self-pay

## 2022-07-29 NOTE — Telephone Encounter (Signed)
Att to contact pt to schedule AWV for 08/02/22 no ans lvm to call office to schedule

## 2022-08-03 DIAGNOSIS — M9905 Segmental and somatic dysfunction of pelvic region: Secondary | ICD-10-CM | POA: Diagnosis not present

## 2022-08-03 DIAGNOSIS — M5386 Other specified dorsopathies, lumbar region: Secondary | ICD-10-CM | POA: Diagnosis not present

## 2022-08-03 DIAGNOSIS — M9904 Segmental and somatic dysfunction of sacral region: Secondary | ICD-10-CM | POA: Diagnosis not present

## 2022-08-03 DIAGNOSIS — M9903 Segmental and somatic dysfunction of lumbar region: Secondary | ICD-10-CM | POA: Diagnosis not present

## 2022-08-17 IMAGING — CT CT HEAD W/O CM
4 series · 16 of 47 positions shown, 18 images · non-contrast
Comparison: None.

CLINICAL DATA: Vision loss, monocular vision loss



[Series 3: head bone · axial · 0.49mm/px · z∈[-197,-165]mm · 3 of 81 slices shown]
[im 9/81  bone]
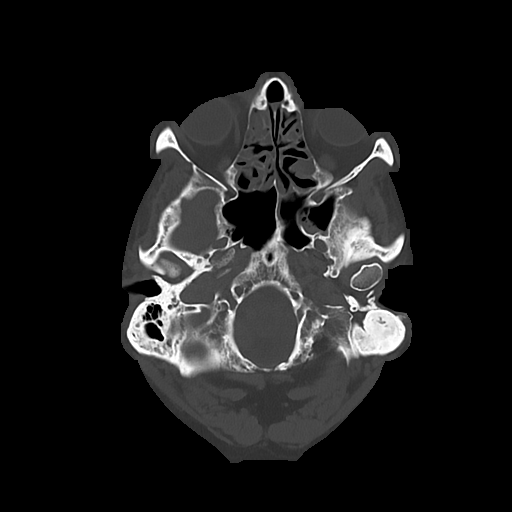
[im 17/81  bone]
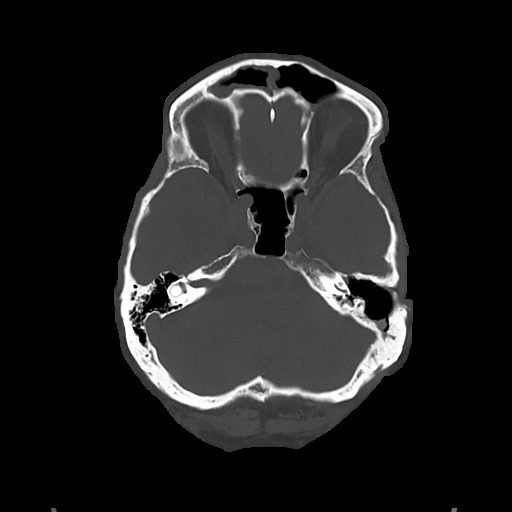
[im 25/81  bone]
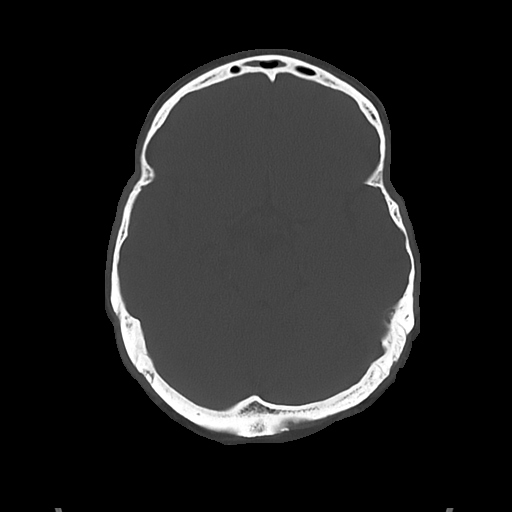

[Series 4: head wo · axial · 0.49mm/px · z∈[-193,-73]mm · 7 of 33 slices shown, 9 images]
[im 5/33  brain]
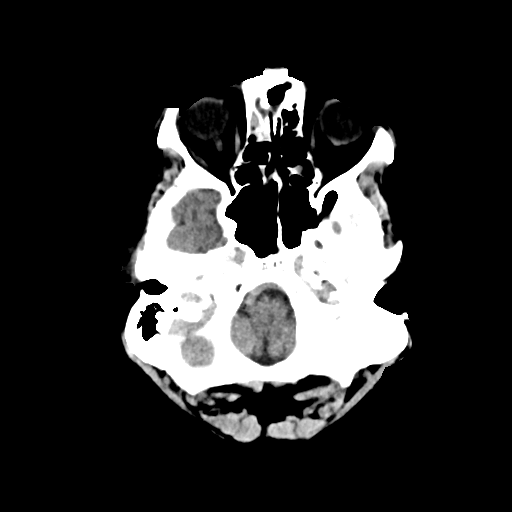
[im 5/33  bone]
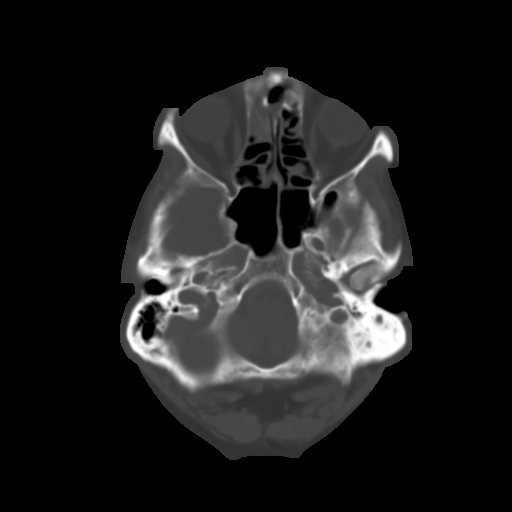
[im 9/33  brain]
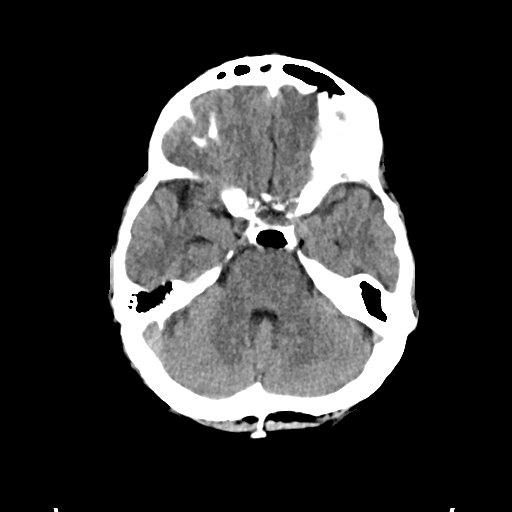
[im 13/33  brain]
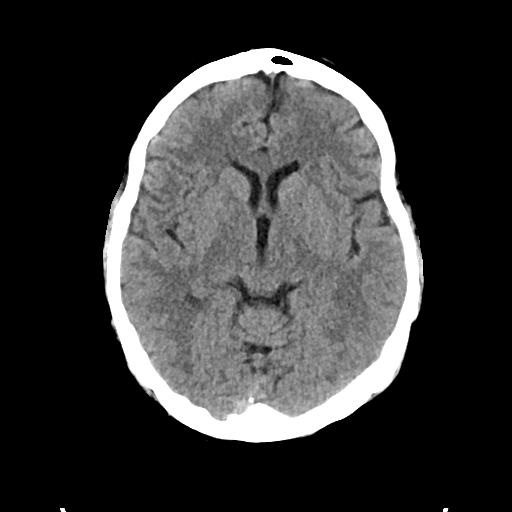
[im 17/33  brain]
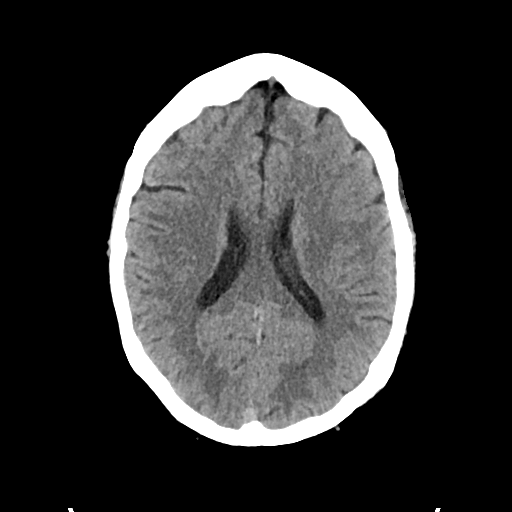
[im 21/33  brain]
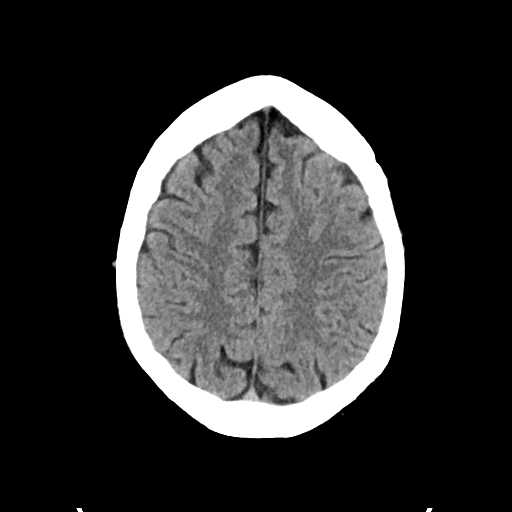
[im 21/33  bone]
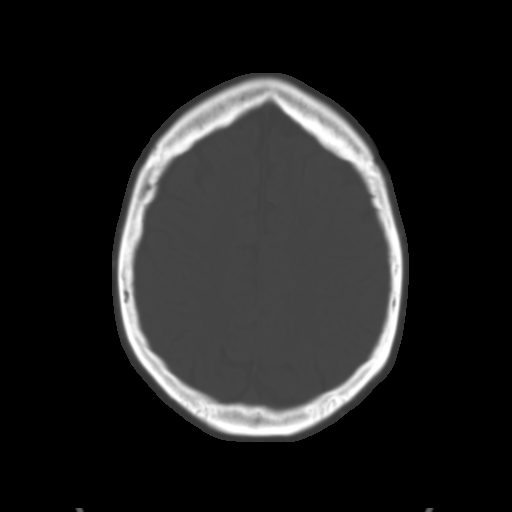
[im 25/33  brain]
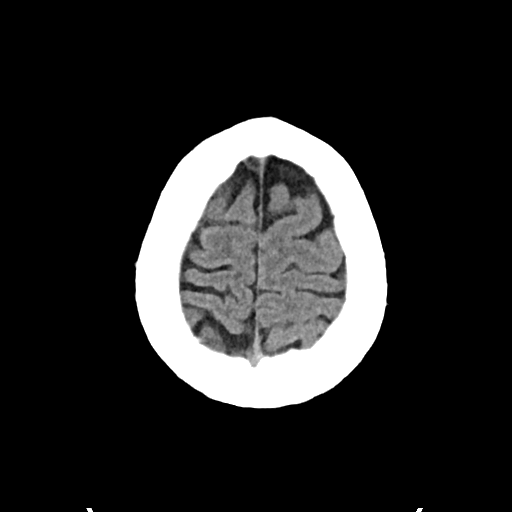
[im 29/33  brain]
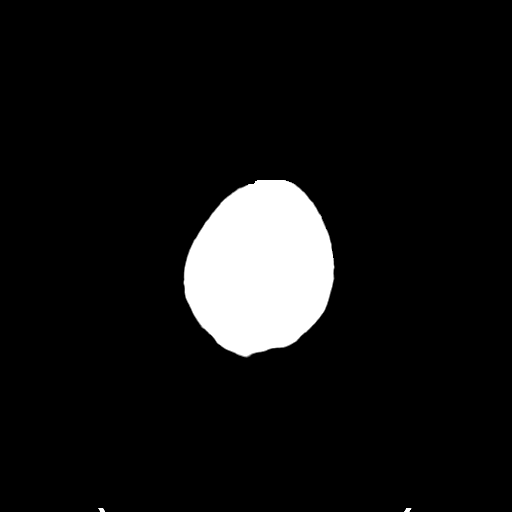

[Series 5: cor soft · coronal · 0.34mm/px · 3 of 73 slices shown]
[im 25/73  brain]
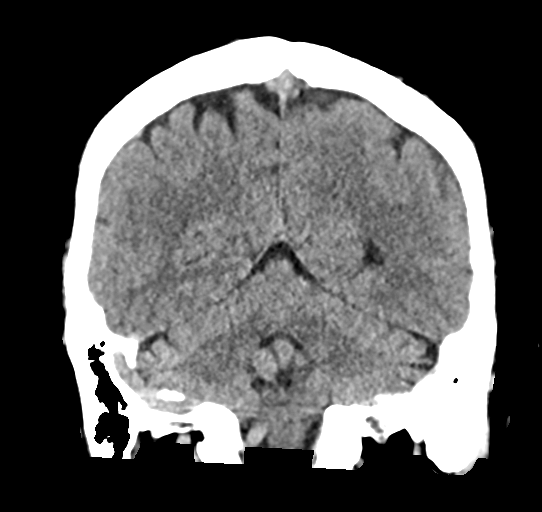
[im 33/73  brain]
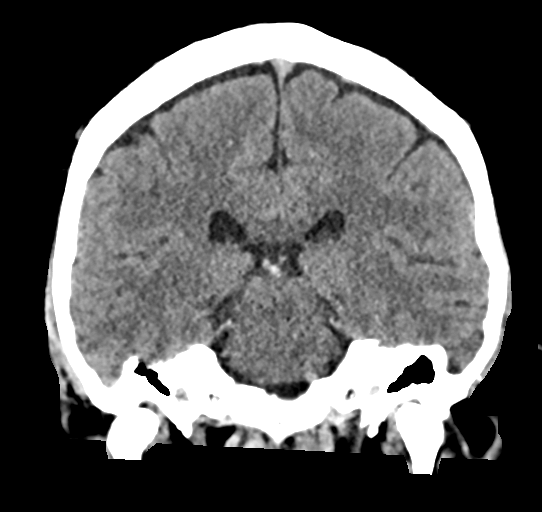
[im 41/73  brain]
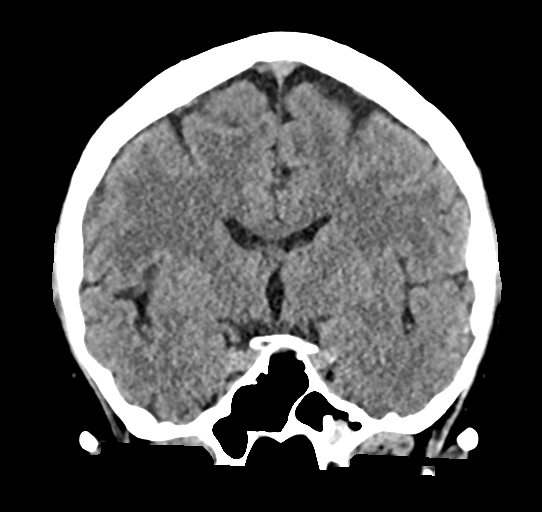

[Series 6: sag soft · sagittal · 0.35mm/px · 3 of 60 slices shown]
[im 20/60  brain]
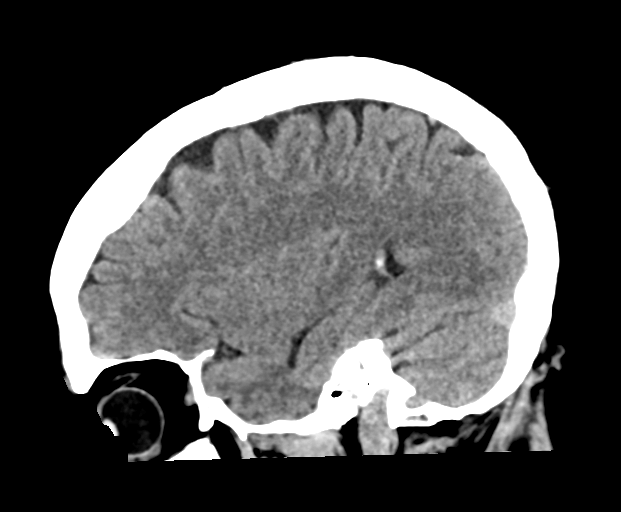
[im 30/60  brain]
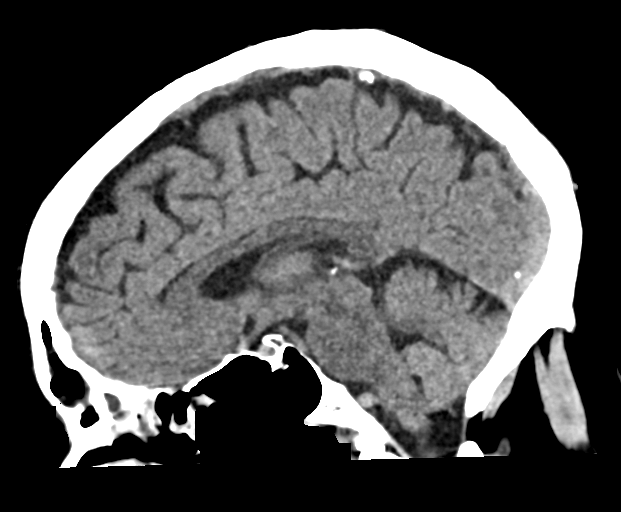
[im 40/60  brain]
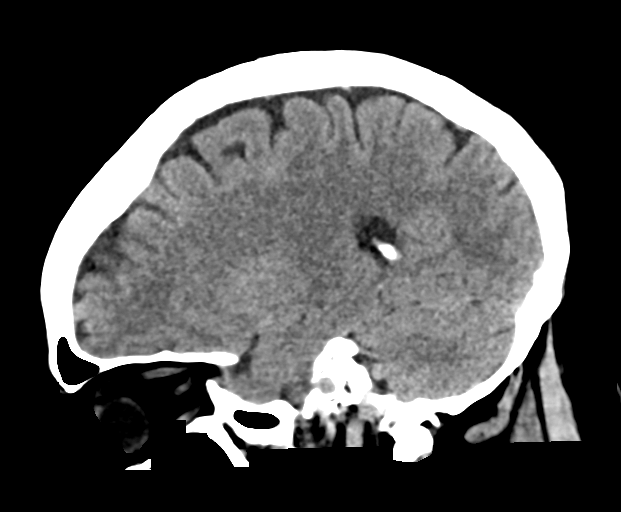

[16 of 47 positions shown; findings below may reference images not displayed]

FINDINGS: Brain: No evidence of acute infarction, hemorrhage, hydrocephalus,
extra-axial collection or mass lesion/mass effect.

Vascular: No hyperdense vessel identified.

Skull: No acute fracture.

Sinuses/Orbits: Pansinus mucosal thickening with frothy secretions
in the sphenoid sinuses.

Other: Left canal wall down mastoidectomy.
IMPRESSION: 1. No evidence of acute intracranial abnormality. If the patient's
reported symptoms continue, consider MRI for more sensitive
evaluation.
2. Paranasal sinus mucosal thickening.

## 2022-08-19 ENCOUNTER — Encounter: Payer: Self-pay | Admitting: Family

## 2022-08-19 ENCOUNTER — Ambulatory Visit (INDEPENDENT_AMBULATORY_CARE_PROVIDER_SITE_OTHER): Payer: 59 | Admitting: Family

## 2022-08-19 VITALS — BP 111/78 | HR 84 | Temp 98.3°F | Resp 16 | Ht 69.69 in | Wt 212.0 lb

## 2022-08-19 DIAGNOSIS — Z1211 Encounter for screening for malignant neoplasm of colon: Secondary | ICD-10-CM

## 2022-08-19 DIAGNOSIS — Z13220 Encounter for screening for lipoid disorders: Secondary | ICD-10-CM | POA: Diagnosis not present

## 2022-08-19 DIAGNOSIS — Z0000 Encounter for general adult medical examination without abnormal findings: Secondary | ICD-10-CM

## 2022-08-19 DIAGNOSIS — Z1159 Encounter for screening for other viral diseases: Secondary | ICD-10-CM

## 2022-08-19 DIAGNOSIS — Z13228 Encounter for screening for other metabolic disorders: Secondary | ICD-10-CM | POA: Diagnosis not present

## 2022-08-19 DIAGNOSIS — E119 Type 2 diabetes mellitus without complications: Secondary | ICD-10-CM | POA: Insufficient documentation

## 2022-08-19 DIAGNOSIS — Z1329 Encounter for screening for other suspected endocrine disorder: Secondary | ICD-10-CM | POA: Diagnosis not present

## 2022-08-19 NOTE — Progress Notes (Signed)
Subjective:   Ronnie Carey is a 67 y.o. male who presents for a Welcome to Medicare exam.   Review of Systems: Established with Endocrinology, Dermatology, Oncology, Orthopedics.  Cardiac Risk Factors include: advanced age (>16mn, >>70women);diabetes mellitus;hypertension;male gender  Objective:    Today's Vitals   08/19/22 1041  BP: 111/78  Pulse: 84  Resp: 16  Temp: 98.3 F (36.8 C)  SpO2: 95%  Weight: 212 lb (96.2 kg)  Height: 5' 9.69" (1.77 m)  PainSc: 0-No pain   Body mass index is 30.69 kg/m.  Physical Exam HENT:     Head: Normocephalic and atraumatic.     Nose: Nose normal.     Mouth/Throat:     Mouth: Mucous membranes are moist.     Pharynx: Oropharynx is clear.  Eyes:     Extraocular Movements: Extraocular movements intact.     Conjunctiva/sclera: Conjunctivae normal.     Pupils: Pupils are equal, round, and reactive to light.  Cardiovascular:     Rate and Rhythm: Normal rate and regular rhythm.     Pulses: Normal pulses.     Heart sounds: Normal heart sounds.  Pulmonary:     Effort: Pulmonary effort is normal.     Breath sounds: Normal breath sounds.  Musculoskeletal:     Cervical back: Normal range of motion and neck supple.  Neurological:     General: No focal deficit present.     Mental Status: He is alert and oriented to person, place, and time.  Psychiatric:        Mood and Affect: Mood normal.        Behavior: Behavior normal.     Medications Outpatient Encounter Medications as of 08/19/2022  Medication Sig   MOUNJARO 2.5 MG/0.5ML Pen Inject 2.5 mg into the skin once a week.   apixaban (ELIQUIS) 2.5 MG TABS tablet Take 1 tablet (2.5 mg total) by mouth 2 (two) times daily.   Blood Glucose Monitoring Suppl (TRUE METRIX METER) w/Device KIT Use as directed   glucose blood (TRUE METRIX BLOOD GLUCOSE TEST) test strip Use as instructed   metFORMIN (GLUCOPHAGE) 500 MG tablet TAKE 1 TABLET BY MOUTH 2 TIMES DAILY WITH A MEAL.    triamcinolone ointment (KENALOG) 0.5 % Apply 1 application topically 2 (two) times daily.   TRUEplus Lancets 28G MISC Use as directed   No facility-administered encounter medications on file as of 08/19/2022.     History: Past Medical History:  Diagnosis Date   History of pulmonary embolus (PE)    [place]) [place]   Past Surgical History:  Procedure Laterality Date   TONSILLECTOMY     TOTAL KNEE ARTHROPLASTY Right 2012    Family History  Problem Relation Age of Onset   Stroke Mother        At age of 89 history of smoking   Social History   Occupational History   Not on file  Tobacco Use   Smoking status: Never    Passive exposure: Never   Smokeless tobacco: Never  Vaping Use   Vaping Use: Never used  Substance and Sexual Activity   Alcohol use: Never   Drug use: Never   Sexual activity: Not Currently   Tobacco Counseling Reports he has never smoked.  Immunizations and Health Maintenance Immunization History  Administered Date(s) Administered   Hepatitis A, Ped/Adol-2 Dose 11/30/2017   Health Maintenance Due  Topic Date Due   COVID-19 Vaccine (1) Never done   FOOT EXAM  Never done   OPHTHALMOLOGY EXAM  Never done   Diabetic kidney evaluation - Urine ACR  Never done   Hepatitis C Screening  Never done   DTaP/Tdap/Td (1 - Tdap) Never done   COLONOSCOPY (Pts 45-40yr Insurance coverage will need to be confirmed)  Never done   Zoster Vaccines- Shingrix (1 of 2) Never done   Pneumonia Vaccine 66 Years old (1 of 1 - PCV) Never done   INFLUENZA VACCINE  Never done   HEMOGLOBIN A1C  02/26/2022    Activities of Daily Living    08/19/2022   10:47 AM  In your present state of health, do you have any difficulty performing the following activities:  Hearing? 0  Vision? 0  Difficulty concentrating or making decisions? 0  Walking or climbing stairs? 0  Dressing or bathing? 0  Doing errands, shopping? 0  Preparing Food and eating ? N   Using the Toilet? N  In the past six months, have you accidently leaked urine? N  Do you have problems with loss of bowel control? N  Managing your Medications? N  Managing your Finances? N  Housekeeping or managing your Housekeeping? N    Physical Exam (optional), or other factors deemed appropriate based on the beneficiary's medical and social history and current clinical standards.  Advanced Directives: Does Patient Have a Medical Advance Directive?: No Would patient like information on creating a medical advance directive?: No - Patient declined    Assessment:    This is a routine wellness  examination for this patient .   Vision/Hearing screen - Vision normal.  - States he has hearing devices arriving soon.  Dietary issues and exercise activities discussed:  Current Exercise Habits: Structured exercise class, Type of exercise: walking;treadmill, Exercise limited by: None identified  Goals: States he wants to grown 1 inch taller   Depression Screen    08/19/2022   10:47 AM 10/13/2021    9:49 AM 08/26/2021    2:21 PM 07/22/2021    2:12 PM  PHQ 2/9 Scores  PHQ - 2 Score 0 0 0 0     Fall Risk    08/19/2022   10:47 AM  [place]  Number falls in past yr: 0  Injury with Fall? 0  Risk for fall due to : No Fall Risks  Follow up Falls evaluation completed    Cognitive Function    08/19/2022   10:49 AM  6CIT Screen  What Year? 0 points  What month? 0 points  What time? 0 points  Count back from 20 0 points  Months in reverse 0 points  Repeat phrase 0 points  Total Score 0 points   Patient Care Team: SCamillia Herter NP as PCP - General (Family Medicine)  Plan:  1. Welcome to Medicare preventive visit - Counseled on 150 minutes of exercise per week as tolerated, healthy eating (including decreased daily intake of saturated fats, cholesterol, added sugars, sodium), STI prevention, and routine healthcare maintenance.  2. Screening  cholesterol level - Routine screening.  - Lipid panel  3. Thyroid disorder screen - Routine screening.  - TSH  4. Need for hepatitis C screening test - Routine screening.  - Hepatitis C Antibody  5. Colon cancer screening - Patient declined.   6. Screening for metabolic disorder - Routine screening.  - AST - ALT  I have personally reviewed and noted the following in the patient's chart:   Medical and  social history Use of alcohol, tobacco or illicit drugs  Current medications and supplements Functional ability and status Nutritional status Physical activity Advanced directives List of other physicians Hospitalizations, surgeries, and ER visits in previous 12 months Vitals Screenings to include cognitive, depression, and falls Referrals and appointments  In addition, I have reviewed and discussed with patient certain preventive protocols, quality metrics, and best practice recommendations. A written personalized care plan for preventive services as well as general preventive health recommendations were provided to patient.    Camillia Herter, NP 08/19/2022

## 2022-08-19 NOTE — Progress Notes (Signed)
Subjective:   Ronnie Carey is a 67 y.o. male who presents for a Welcome to Medicare exam.   Review of Systems: Defer to PCP  Cardiac Risk Factors include: advanced age (>62mn, >>56women);diabetes mellitus;hypertension;male gender     Objective:    Today's Vitals   08/19/22 1041  BP: 111/78  Pulse: 84  Resp: 16  Temp: 98.3 F (36.8 C)  SpO2: 95%  Weight: 212 lb (96.2 kg)  Height: 5' 9.69" (1.77 m)  PainSc: 0-No pain   Body mass index is 30.69 kg/m.  Medications Outpatient Encounter Medications as of 08/19/2022  Medication Sig   MOUNJARO 2.5 MG/0.5ML Pen Inject 2.5 mg into the skin once a week.   apixaban (ELIQUIS) 2.5 MG TABS tablet Take 1 tablet (2.5 mg total) by mouth 2 (two) times daily.   Blood Glucose Monitoring Suppl (TRUE METRIX METER) w/Device KIT Use as directed   glucose blood (TRUE METRIX BLOOD GLUCOSE TEST) test strip Use as instructed   metFORMIN (GLUCOPHAGE) 500 MG tablet TAKE 1 TABLET BY MOUTH 2 TIMES DAILY WITH A MEAL.   triamcinolone ointment (KENALOG) 0.5 % Apply 1 application topically 2 (two) times daily.   TRUEplus Lancets 28G MISC Use as directed   No facility-administered encounter medications on file as of 08/19/2022.     History: Past Medical History:  Diagnosis Date   History of pulmonary embolus (PE)    SBO (small bowel obstruction) ([facility] 05/2018   Past Surgical History:  Procedure Laterality Date   TONSILLECTOMY     TOTAL KNEE ARTHROPLASTY Right 2012    Family History  Problem Relation Age of Onset   Stroke Mother        At age of 1055 history of smoking   Social History   Occupational History   Not on file  Tobacco Use   Smoking status: Never    Passive exposure: Never   Smokeless tobacco: Never  Vaping Use   Vaping Use: Never used  Substance and Sexual Activity   Alcohol use: Never   Drug use: Never   Sexual activity: Not Currently   Tobacco Counseling Counseling given: Not Answered   Immunizations and  Health Maintenance Immunization History  Administered Date(s) Administered   Hepatitis A, Ped/Adol-2 Dose 11/30/2017   Health Maintenance Due  Topic Date Due   COVID-19 Vaccine (1) Never done   FOOT EXAM  Never done   OPHTHALMOLOGY EXAM  Never done   Diabetic kidney evaluation - Urine ACR  Never done   Hepatitis C Screening  Never done   DTaP/Tdap/Td (1 - Tdap) Never done   COLONOSCOPY (Pts 45-464yrInsurance coverage will need to be confirmed)  Never done   Zoster Vaccines- Shingrix (1 of 2) Never done   Pneumonia Vaccine 6585Years old (1 of 1 - PCV) Never done   INFLUENZA VACCINE  Never done   HEMOGLOBIN A1C  02/26/2022    Activities of Daily Living    08/19/2022   10:47 AM  In your present state of health, do you have any difficulty performing the following activities:  Hearing? 0  Vision? 0  Difficulty concentrating or making decisions? 0  Walking or climbing stairs? 0  Dressing or bathing? 0  Doing errands, shopping? 0  Preparing Food and eating ? N  Using the Toilet? N  In the past six months, have you accidently leaked urine? N  Do you have problems with loss of bowel control? N  Managing your Medications? N  Managing  your Finances? N  Housekeeping or managing your Housekeeping? N    Physical Exam  (optional), or other factors deemed appropriate based on the beneficiary's medical and social history and current clinical standards.  Advanced Directives: Does Patient Have a Medical Advance Directive?: No Would patient like information on creating a medical advance directive?: No - Patient declined    Assessment:    This is a routine wellness  examination for this patient .   Vision/Hearing screen No results found.  Dietary issues and exercise activities discussed:  Current Exercise Habits: Structured exercise class, Type of exercise: walking;treadmill, Exercise limited by: None identified   Goals   None     Depression Screen    08/19/2022   10:47 AM  10/13/2021    9:49 AM 08/26/2021    2:21 PM 07/22/2021    2:12 PM  PHQ 2/9 Scores  PHQ - 2 Score 0 0 0 0     Fall Risk    08/19/2022   10:47 AM  [place] in the past year? 0  Number falls in past yr: 0  Injury with Fall? 0  Risk for fall due to : No Fall Risks  Follow up Falls evaluation completed    Cognitive Function        08/19/2022   10:49 AM  6CIT Screen  What Year? 0 points  What month? 0 points  What time? 0 points  Count back from 20 0 points  Months in reverse 0 points  Repeat phrase 0 points  Total Score 0 points    Patient Care Team: Camillia Herter, NP as PCP - General (Nurse Practitioner)     Plan:     I have personally reviewed and noted the following in the patient's chart:   Medical and social history Use of alcohol, tobacco or illicit drugs  Current medications and supplements Functional ability and status Nutritional status Physical activity Advanced directives List of other physicians Hospitalizations, surgeries, and ER visits in previous 12 months Vitals Screenings to include cognitive, depression, and falls Referrals and appointments  In addition, I have reviewed and discussed with patient certain preventive protocols, quality metrics, and best practice recommendations. A written personalized care plan for preventive services as well as general preventive health recommendations were provided to patient.    Elmon Else, [facility] 08/19/2022

## 2022-08-20 ENCOUNTER — Other Ambulatory Visit: Payer: Self-pay | Admitting: Family

## 2022-08-20 DIAGNOSIS — E7800 Pure hypercholesterolemia, unspecified: Secondary | ICD-10-CM

## 2022-08-20 DIAGNOSIS — E785 Hyperlipidemia, unspecified: Secondary | ICD-10-CM

## 2022-08-20 DIAGNOSIS — I700 Atherosclerosis of aorta: Secondary | ICD-10-CM

## 2022-08-20 LAB — LIPID PANEL
Chol/HDL Ratio: 3.9 ratio (ref 0.0–5.0)
Cholesterol, Total: 229 mg/dL — ABNORMAL HIGH (ref 100–199)
HDL: 58 mg/dL (ref >39)
LDL Chol Calc (NIH): 149 mg/dL — ABNORMAL HIGH (ref 0–99)
Triglycerides: 124 mg/dL (ref 0–149)
VLDL Cholesterol Cal: 22 mg/dL (ref 5–40)

## 2022-08-20 LAB — AST: AST: 18 IU/L (ref 0–40)

## 2022-08-20 LAB — ALT: ALT: 18 IU/L (ref 0–44)

## 2022-08-20 LAB — TSH: TSH: 2.97 u[IU]/mL (ref 0.450–4.500)

## 2022-08-20 LAB — HEPATITIS C ANTIBODY: Hep C Virus Ab: NONREACTIVE

## 2022-09-17 ENCOUNTER — Other Ambulatory Visit: Payer: Self-pay | Admitting: Physician Assistant

## 2022-09-17 ENCOUNTER — Inpatient Hospital Stay: Payer: 59 | Attending: Physician Assistant

## 2022-09-17 ENCOUNTER — Inpatient Hospital Stay (HOSPITAL_BASED_OUTPATIENT_CLINIC_OR_DEPARTMENT_OTHER): Payer: 59 | Admitting: Physician Assistant

## 2022-09-17 VITALS — BP 101/73 | HR 79 | Temp 97.8°F | Resp 17 | Ht 69.69 in | Wt 220.0 lb

## 2022-09-17 DIAGNOSIS — Z86711 Personal history of pulmonary embolism: Secondary | ICD-10-CM | POA: Diagnosis not present

## 2022-09-17 DIAGNOSIS — Z823 Family history of stroke: Secondary | ICD-10-CM | POA: Insufficient documentation

## 2022-09-17 DIAGNOSIS — R739 Hyperglycemia, unspecified: Secondary | ICD-10-CM | POA: Diagnosis not present

## 2022-09-17 DIAGNOSIS — I825Z3 Chronic embolism and thrombosis of unspecified deep veins of distal lower extremity, bilateral: Secondary | ICD-10-CM

## 2022-09-17 DIAGNOSIS — Z86718 Personal history of other venous thrombosis and embolism: Secondary | ICD-10-CM | POA: Insufficient documentation

## 2022-09-17 DIAGNOSIS — Z7901 Long term (current) use of anticoagulants: Secondary | ICD-10-CM | POA: Diagnosis not present

## 2022-09-17 DIAGNOSIS — R42 Dizziness and giddiness: Secondary | ICD-10-CM | POA: Diagnosis not present

## 2022-09-17 DIAGNOSIS — Z79899 Other long term (current) drug therapy: Secondary | ICD-10-CM | POA: Diagnosis not present

## 2022-09-17 LAB — CBC WITH DIFFERENTIAL (CANCER CENTER ONLY)
Abs Immature Granulocytes: 0.01 10*3/uL (ref 0.00–0.07)
Basophils Absolute: 0 10*3/uL (ref 0.0–0.1)
Basophils Relative: 1 %
Eosinophils Absolute: 0.5 10*3/uL (ref 0.0–0.5)
Eosinophils Relative: 8 %
HCT: 39.7 % (ref 39.0–52.0)
Hemoglobin: 13.9 g/dL (ref 13.0–17.0)
Immature Granulocytes: 0 %
Lymphocytes Relative: 30 %
Lymphs Abs: 1.8 10*3/uL (ref 0.7–4.0)
MCH: 31.1 pg (ref 26.0–34.0)
MCHC: 35 g/dL (ref 30.0–36.0)
MCV: 88.8 fL (ref 80.0–100.0)
Monocytes Absolute: 0.7 10*3/uL (ref 0.1–1.0)
Monocytes Relative: 11 %
Neutro Abs: 3.1 10*3/uL (ref 1.7–7.7)
Neutrophils Relative %: 50 %
Platelet Count: 266 10*3/uL (ref 150–400)
RBC: 4.47 MIL/uL (ref 4.22–5.81)
RDW: 12.6 % (ref 11.5–15.5)
WBC Count: 6.2 10*3/uL (ref 4.0–10.5)
nRBC: 0 % (ref 0.0–0.2)

## 2022-09-17 LAB — CMP (CANCER CENTER ONLY)
ALT: 12 U/L (ref 0–44)
AST: 11 U/L — ABNORMAL LOW (ref 15–41)
Albumin: 4.4 g/dL (ref 3.5–5.0)
Alkaline Phosphatase: 56 U/L (ref 38–126)
Anion gap: 6 (ref 5–15)
BUN: 17 mg/dL (ref 8–23)
CO2: 30 mmol/L (ref 22–32)
Calcium: 9.7 mg/dL (ref 8.9–10.3)
Chloride: 101 mmol/L (ref 98–111)
Creatinine: 1.06 mg/dL (ref 0.61–1.24)
GFR, Estimated: >60 mL/min (ref >60)
Glucose, Bld: 314 mg/dL — ABNORMAL HIGH (ref 70–99)
Potassium: 4.8 mmol/L (ref 3.5–5.1)
Sodium: 137 mmol/L (ref 135–145)
Total Bilirubin: 0.4 mg/dL (ref 0.3–1.2)
Total Protein: 7.1 g/dL (ref 6.5–8.1)

## 2022-09-17 NOTE — Progress Notes (Unsigned)
[facility] [phone]   [phone]  PROGRESS NOTE  Patient Care Team: Rema Fendt, NP as PCP - General (Nurse Practitioner)  Hematological/Oncological History  # Recurrent DVT and PE 1) 10/01/2010: After knee replacement, patient developed lower extremity swelling and chest pain.  -Doppler US: Extensive left lower extremity DVT involving the inferior common femoral vein and popliteal vein.  -CT chest: acute pulmonary embolism involving a branch of the right lower lobe pulmonary artery.  -Completed course of anticoagulation with improvement of symptoms.   2) 10/13/2020-10/16/2020: Admitted for acute onset shortness of breath and substernal chest pain.  -CTA chest: Significant bilateral pulmonary emboli with evidence of saddle component centrally. Elevated RV/LV ratio is noted of 3.Patchy airspace opacity in the medial aspect of the left upper lobe which may represent edema or early infiltrate. -Doppler US:Findings consistent with acute deep vein thrombosis involving the right femoral vein, right popliteal vein,right posterior tibial veins, right peroneal veins, and right gastrocnemius veins. DVT in the popliteal vein is mobile. Findings consistent with age indeterminate deep vein thrombosis involving the left common femoral vein, SF junction, left femoral vein, left proximal profunda vein, left popliteal vein, left posterior tibial veins, and left gastrocnemius veins. -Echo: Moderately reduced right ventricular function secondary to PE -Initially treated with heparin infusion and then transitioned to Eliquis.  3) 03/17/2021: Establish care with Georga Kaufmann PA-C   4) 09/16/2021: Transitioned to maintenance dose of Eliquis 2.5 mg twice daily.    HISTORY OF PRESENTING ILLNESS:  Hulbert Branscome 67 y.o. 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 He is unaccompanied for this visit.   At today's visit, Mr.  Oddo reports that he is doing well with his eliquis therapy without any signs of bruising or bleeding. He is trying to better control his blood sugars and work with his PCP to do so. He is compliant with taking metformin as prescribed. He is trying to stay active and eat a balanced diet. He denies any GI symptoms such as nausea, vomiting, diarrhea or constipation. He does have some vertigo which causes some interference with his balance. He denies any syncopal episodes.   He denies fevers, chills, night sweats, shortness of breath, chest pain or cough. He has no other complaints. Rest of the 10 point ROS is below.   MEDICAL HISTORY:  Past Medical History:  Diagnosis Date   History of pulmonary embolus (PE)    SBO (small bowel obstruction) ([facility]) 05/2018    SURGICAL HISTORY: Past Surgical History:  Procedure Laterality Date   TONSILLECTOMY     TOTAL KNEE ARTHROPLASTY Right 2012    SOCIAL HISTORY: Social History   Socioeconomic History   Marital status: Single    Spouse name: Not on file   Number of children: Not on file   Years of education: Not on file   Highest education level: Not on file  Occupational History   Not on file  Tobacco Use   Smoking status: Never    Passive exposure: Never   Smokeless tobacco: Never  Vaping Use   Vaping Use: Never used  Substance and Sexual Activity   Alcohol use: Never   Drug use: Never   Sexual activity: Not Currently  Other Topics Concern   Not on file  Social History Narrative   Not on file   Social Determinants of Health   Financial Resource Strain: Not on file  Food Insecurity: No Food Insecurity (08/19/2022)  Hunger Vital Sign    Worried About Running Out of Food in the Last Year: Never true    Ran Out of Food in the Last Year: Never true  Transportation Needs: No Transportation Needs (08/19/2022)   PRAPARE - Administrator, Civil Service (Medical): No    Lack of Transportation (Non-Medical): No  Physical  Activity: Not on file  Stress: Not on file  Social Connections: Not on file  Intimate Partner Violence: Not At Risk (08/19/2022)   Humiliation, Afraid, Rape, and Kick questionnaire    Fear of Current or Ex-Partner: No    Emotionally Abused: No    Physically Abused: No    Sexually Abused: No    FAMILY HISTORY: Family History  Problem Relation Age of Onset   Stroke Mother        At age of 47, history of smoking    ALLERGIES:  has No Known Allergies.  MEDICATIONS:  Current Outpatient Medications  Medication Sig Dispense Refill   apixaban (ELIQUIS) 2.5 MG TABS tablet Take 1 tablet (2.5 mg total) by mouth 2 (two) times daily. 60 tablet 6   Blood Glucose Monitoring Suppl (TRUE METRIX METER) w/Device KIT Use as directed 1 kit 0   glucose blood (TRUE METRIX BLOOD GLUCOSE TEST) test strip Use as instructed 100 each 12   metFORMIN (GLUCOPHAGE) 500 MG tablet TAKE 1 TABLET BY MOUTH 2 TIMES DAILY WITH A MEAL. 180 tablet 0   triamcinolone ointment (KENALOG) 0.5 % Apply 1 application topically 2 (two) times daily. 80 g 0   TRUEplus Lancets 28G MISC Use as directed 100 each 4   MOUNJARO 2.5 MG/0.5ML Pen Inject 2.5 mg into the skin once a week. (Patient not taking: Reported on 09/17/2022)     No current facility-administered medications for this visit.    REVIEW OF SYSTEMS:   Constitutional: ( - ) fevers, ( - )  chills , ( - ) night sweats Eyes: ( - ) blurriness of vision, ( - ) double vision, ( - ) watery eyes Ears, nose, mouth, throat, and face: ( - ) mucositis, ( - ) sore throat Respiratory: ( - ) cough, ( - ) dyspnea, ( - ) wheezes Cardiovascular: ( - ) palpitation, ( - ) chest discomfort, ( - ) lower extremity swelling Gastrointestinal:  ( - ) nausea, ( - ) heartburn, ( - ) change in bowel habits Skin: ( - ) abnormal skin rashes Lymphatics: ( - ) new lymphadenopathy, ( - ) easy bruising Neurological: ( - ) numbness, ( - ) tingling, ( - ) new weaknesses Behavioral/Psych: ( - ) mood  change, ( - ) new changes  All other systems were reviewed with the patient and are negative.  PHYSICAL EXAMINATION: ECOG PERFORMANCE STATUS: 0 - Asymptomatic  Vitals:   09/17/22 1041  BP: 101/73  Pulse: 79  Resp: 17  Temp: 97.8 F (36.6 C)  SpO2: 99%   Filed Weights   09/17/22 1041  Weight: 220 lb (99.8 kg)    GENERAL: well appearing male in NAD  SKIN: skin color, texture, turgor are normal, no rashes or significant lesions EYES: conjunctiva are pink and non-injected, sclera clear LUNGS: clear to auscultation and percussion with normal breathing effort HEART: regular rate & rhythm and no murmurs. No lower extremity edema.  Musculoskeletal: no cyanosis of digits and no clubbing  PSYCH: alert & oriented x 3, fluent speech NEURO: no focal motor/sensory deficits  LABORATORY DATA:  I have reviewed the data  as listed    Latest Ref Rng & Units 09/17/2022   10:07 AM 03/18/2022   10:25 AM 09/28/2021   12:39 PM  CBC  WBC 4.0 - 10.5 K/uL 6.2  5.5    Hemoglobin 13.0 - 17.0 g/dL 16.1  09.6  04.5   Hematocrit 39.0 - 52.0 % 39.7  41.8  43.0   Platelets 150 - 400 K/uL 266  278         Latest Ref Rng & Units 09/17/2022   10:07 AM 08/19/2022   12:00 AM 03/18/2022   10:25 AM  CMP  Glucose 70 - 99 mg/dL 409   811   BUN 8 - 23 mg/dL 17   16   Creatinine 9.14 - 1.24 mg/dL 7.82   9.56   Sodium 213 - 145 mmol/L 137   137   Potassium 3.5 - 5.1 mmol/L 4.8   4.2   Chloride 98 - 111 mmol/L 101   104   CO2 22 - 32 mmol/L 30   25   Calcium 8.9 - 10.3 mg/dL 9.7   9.5   Total Protein 6.5 - 8.1 g/dL 7.1   7.5   Total Bilirubin 0.3 - 1.2 mg/dL 0.4   0.7   Alkaline Phos 38 - 126 U/L 56   63   AST 15 - 41 U/L 11  18  17    ALT 0 - 44 U/L 12  18  25      RADIOGRAPHIC STUDIES: I have personally reviewed the radiological images as listed and agreed with the findings in the report. No results found.  ASSESSMENT & PLAN Ronnie Carey is a 67 y.o. male returns for a follow up for   recurrent DVT/PE.   #Recurrent PE and DVT: --First episode was in April 2012 following knee replacement.  --Second and most recent episode in May 2022, no definitive provoking factor.  --Workup from 03/17/2021 showed no evidence of antiphospholipid syndrome. --Recommend indefinite anticoagulation due to recurrent VTEs and most recent episode that involved a saddle embolism without a clear provoking factor.  Plan: --Labs today were reviewed and require no intervention. WBC 6.2, Hgb 13.9, Plt 266 --Currently taking Eliquis 2.5 mg twice daily.  No prohibitive toxicities. Recommend to continue.  --RTC in 12 months with repeat labs.   #Hyperglycemia: --Glucose level was 314 today --Patient did note that he had a heavy breakfast before getting labs drawn today --Encouraged to check his blood sugars at home and follow up with his PCP  No orders of the defined types were placed in this encounter.   All questions were answered. The patient knows to call the clinic with any problems, questions or concerns.  I have spent a total of 25 minutes minutes of face-to-face and non-face-to-face time, preparing to see the patient, performing a medically appropriate examination, counseling and educating the patient, ordering meds, documenting clinical information in the electronic health record,  and care coordination.   Georga Kaufmann PA-C Dept of Hematology and Oncology [facility] Phone: [phone]

## 2022-10-03 ENCOUNTER — Encounter (HOSPITAL_COMMUNITY): Payer: Self-pay | Admitting: Emergency Medicine

## 2022-10-03 ENCOUNTER — Ambulatory Visit (HOSPITAL_COMMUNITY)
Admission: EM | Admit: 2022-10-03 | Discharge: 2022-10-03 | Disposition: A | Discharge status: Home / Self Care | Payer: 59 | Attending: Internal Medicine | Admitting: Internal Medicine

## 2022-10-03 DIAGNOSIS — H60392 Other infective otitis externa, left ear: Secondary | ICD-10-CM

## 2022-10-03 DIAGNOSIS — H6121 Impacted cerumen, right ear: Secondary | ICD-10-CM

## 2022-10-03 MED ORDER — OFLOXACIN 0.3 % OT SOLN
10.0000 [drp] | Freq: Every day | OTIC | 0 refills | Status: AC
Start: 2022-10-03 — End: ?

## 2022-10-03 NOTE — Discharge Instructions (Addendum)
Use ofloxacin ear drops 10 drops to the left ear canal for the next 7 days to treat infection.  Use debrox ear drops to the right ear.  Follow-up with ear nose and throat as needed.  If you develop any new or worsening symptoms or do not improve in the next 2 to 3 days, please return.  If your symptoms are severe, please go to the emergency room.  Follow-up with your primary care provider for further evaluation and management of your symptoms as well as ongoing wellness visits.  I hope you feel better!

## 2022-10-03 NOTE — ED Provider Notes (Signed)
[facility]    CSN: [id number] Arrival date & time: 10/03/22  1404      History   Chief Complaint Chief Complaint  Patient presents with   Ear Fullness   Dizziness    HPI Ronnie Carey is a 67 y.o. male.   Patient presents to urgent care for evaluation of dizziness and pressure to the left ear that started a few days ago.  He states he has a history of wax buildup in both ears which causes worsening dizziness.  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  He has been seen by ear nose and throat for many years and states that he is confident that whenever he uses irrigation/drops to the left ear the fluid gets behind the eardrum. He has perforated the right eardrum in the past as well but states this was repaired by ENT and he has been cleared to use irrigation/drops to that ear. Denies recent trauma/injury to the head or ears. Reports "pressure" sensation to the left ear. Feels as though are are "hard clumps" of drainage in ear. He uses Q-tips to the ears intermittently to clean them out. Denies recent antibiotic or steroid use. Denies recent submerging head under water/swimming.    Ear Fullness  Dizziness   Past Medical History:  Diagnosis Date   History of pulmonary embolus (PE)    SBO (small bowel obstruction) (H[facility]) 05/2018    Patient Active Problem List   Diagnosis Date Noted   Diabetes mellitus without complication (HCC) 08/19/2022   Hardening of the aorta (main artery of the heart) (HCC) 12/23/2021   Hyperglycemia due to type 2 diabetes mellitus (HCC) 12/23/2021   Pure hypercholesterolemia 12/23/2021   Dyslipidemia (high LDL; low HDL) 07/22/2021   Hyperlipidemia 07/22/2021   Arthralgia 07/22/2021   Low back pain 07/22/2021   Obesity 07/22/2021   Osteoarthritis of multiple joints 07/22/2021   Pulmonary embolism (HCC) 10/13/2020   Gastroesophageal reflux disease without esophagitis 02/02/2019   History  of pulmonary embolism 02/02/2019   Personal history of DVT (deep vein thrombosis) 01/10/2019   Incarcerated umbilical hernia 08/24/2018   Osteoarthritis 06/07/2008    Past Surgical History:  Procedure Laterality Date   TONSILLECTOMY     TOTAL KNEE ARTHROPLASTY Right 2012       Home Medications    Prior to Admission medications   Medication Sig Start Date End Date Taking? Authorizing Provider  ofloxacin (FLOXIN) 0.3 % OTIC solution Place 10 drops into the left ear daily. 10/03/22  Yes Carlisle Beers, FNP  apixaban (ELIQUIS) 2.5 MG TABS tablet Take 1 tablet (2.5 mg total) by mouth 2 (two) times daily. 05/09/22   Briant Cedar, PA-C  Blood Glucose Monitoring Suppl (TRUE METRIX METER) w/Device KIT Use as directed 08/26/21   Rema Fendt, NP  glucose blood (TRUE METRIX BLOOD GLUCOSE TEST) test strip Use as instructed 08/26/21   Rema Fendt, NP  metFORMIN (GLUCOPHAGE) 500 MG tablet TAKE 1 TABLET BY MOUTH 2 TIMES DAILY WITH A MEAL. 11/13/21   Rema Fendt, NP  MOUNJARO 2.5 MG/0.5ML Pen Inject 2.5 mg into the skin once a week. Patient not taking: Reported on 09/17/2022 07/13/22   [provider]  triamcinolone ointment (KENALOG) 0.5 % Apply 1 application topically 2 (two) times daily. 06/15/21   Rema Fendt, NP  TRUEplus Lancets 28G MISC Use as directed 08/26/21   Rema Fendt, NP    Family  History Family History  Problem Relation Age of Onset   Stroke Mother        At age of 74, history of smoking    Social History Social History   Tobacco Use   Smoking status: Never    Passive exposure: Never   Smokeless tobacco: Never  Vaping Use   Vaping Use: Never used  Substance Use Topics   Alcohol use: Never   Drug use: Never     Allergies   Patient has no known allergies.   Review of Systems Review of Systems  Neurological:  Positive for dizziness.  Per HPI   Physical Exam Triage Vital Signs ED Triage Vitals  Enc Vitals Group     BP 10/03/22  1459 118/78     Pulse Rate 10/03/22 1459 61     Resp 10/03/22 1459 17     Temp 10/03/22 1459 97.9 F (36.6 C)     Temp Source 10/03/22 1459 Oral     SpO2 10/03/22 1459 97 %     Weight --      Height --      Head Circumference --      Peak Flow --      Pain Score 10/03/22 1458 0     Pain Loc --      Pain Edu? --      Excl. in GC? --    No data found.  Updated Vital Signs BP 118/78 (BP Location: Right Arm)   Pulse 61   Temp 97.9 F (36.6 C) (Oral)   Resp 17   SpO2 97%   Visual Acuity Right Eye Distance:   Left Eye Distance:   Bilateral Distance:    Right Eye Near:   Left Eye Near:    Bilateral Near:     Physical Exam Vitals and nursing note reviewed.  Constitutional:      Appearance: He is not ill-appearing or toxic-appearing.  HENT:     Head: Normocephalic and atraumatic.     Right Ear: Hearing and external ear normal. There is impacted cerumen.     Left Ear: Hearing and external ear normal. Drainage (scant crusty purulent drainage present to the left ear canal) present. A middle ear effusion is present.     Nose: Nose normal.     Mouth/Throat:     Lips: Pink.  Eyes:     General: Lids are normal. Vision grossly intact. Gaze aligned appropriately.     Extraocular Movements: Extraocular movements intact.     Conjunctiva/sclera: Conjunctivae normal.  Pulmonary:     Effort: Pulmonary effort is normal.  Musculoskeletal:     Cervical back: Neck supple.  Lymphadenopathy:     Cervical: No cervical adenopathy.  Skin:    General: Skin is warm and dry.     Capillary Refill: Capillary refill takes less than 2 seconds.     Findings: No rash.  Neurological:     General: No focal deficit present.     Mental Status: He is alert and oriented to person, place, and time. Mental status is at baseline.     Cranial Nerves: No dysarthria or facial asymmetry.  Psychiatric:        Mood and Affect: Mood normal.        Speech: Speech normal.        Behavior: Behavior normal.         Thought Content: Thought content normal.        Judgment: Judgment normal.  UC Treatments / Results  Labs (all labs ordered are listed, but only abnormal results are displayed) Labs Reviewed - No data to display  EKG   Radiology No results found.  Procedures Procedures (including critical care time)  Medications Ordered in UC Medications - No data to display  Initial Impression / Assessment and Plan / UC Course  I have reviewed the triage vital signs and the nursing notes.  Pertinent labs & imaging results that were available during my care of the patient were reviewed by me and considered in my medical decision making (see chart for details).   1. Infective otitis externa of left ear, impacted cerumen of right ear Ofloxacin ear drops to left ear 10 drops once daily for 7 days to treat otitis externa. Although I am able to visualize the left TM and it appears to be intact, patient states fluid can get from the outside behind the left ear drum so we will use ofloxacin as this is the safer choice. Right ear flushed via lavage by nursing staff to reveal normal TM and clear canal. May use debrox to right ear canal as needed. Information for ENT follow-up given to be used as needed.Neurologic exam stable and without focal deficit.   Discussed physical exam and available lab work findings in clinic with patient.  Counseled patient regarding appropriate use of medications and potential side effects for all medications recommended or prescribed today. Discussed red flag signs and symptoms of worsening condition,when to call the PCP office, return to urgent care, and when to seek higher level of care in the emergency department. Patient verbalizes understanding and agreement with plan. All questions answered. Patient discharged in stable condition.    Final Clinical Impressions(s) / UC Diagnoses   Final diagnoses:  Infective otitis externa of left ear  Impacted cerumen of right  ear     Discharge Instructions      Use ofloxacin ear drops 10 drops to the left ear canal for the next 7 days to treat infection.  Use debrox ear drops to the right ear.  Follow-up with ear nose and throat as needed.  If you develop any new or worsening symptoms or do not improve in the next 2 to 3 days, please return.  If your symptoms are severe, please go to the emergency room.  Follow-up with your primary care provider for further evaluation and management of your symptoms as well as ongoing wellness visits.  I hope you feel better!     ED Prescriptions     Medication Sig Dispense Auth. Provider   ofloxacin (FLOXIN) 0.3 % OTIC solution Place 10 drops into the left ear daily. 5 mL Carlisle Beers, FNP      PDMP not reviewed this encounter.   Carlisle Beers, [place] 10/03/22 2215

## 2022-10-03 NOTE — ED Triage Notes (Signed)
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 Reports has drainage build up in left ear that makes dizziness worse. Needs ear cleaned but can't use Debrox or irrigation in ears.

## 2022-10-11 ENCOUNTER — Encounter: Payer: Self-pay | Admitting: Family

## 2022-10-11 ENCOUNTER — Ambulatory Visit: Payer: Self-pay | Admitting: *Deleted

## 2022-10-11 NOTE — Telephone Encounter (Signed)
Please call patient

## 2022-10-11 NOTE — Telephone Encounter (Signed)
Report to the Emergency Department or Urgent Care for immediate evaluation/management. Follow-up with primary provider at discharge.

## 2022-10-11 NOTE — Telephone Encounter (Signed)
  Chief Complaint: Fall Symptoms: Tripped and fell Sat AM. States landed on left side, rib area. 5-6/10 pain, worsens with movement "Sneezing." States "Rib cage misshapen."  Reports bruising, on Eliquis.  Frequency: Saturday Pertinent Negatives: Patient denies SOB Disposition: [] ED /[x] Urgent Care (no appt availability in office) / [] Appointment(In office/virtual)/ []  [facility]/ [] Home Care/ [] Refused Recommended Disposition /[] [facility]/ []  Follow-up with PCP Additional Notes: Pt declines UC. States cannot pay the amount. First available appt Thursday. Reiterated need for UC/ED eval. Declines. Assured pt NT would route to practice for PCPs review and final disposition. Message sent to [facility] as well to alert of situation. Reason for Disposition  Injury (or injuries) that need emergency care  Answer Assessment - Initial Assessment Questions 1. MECHANISM: "How did the fall happen?"     Tripped and fell 2. DOMESTIC VIOLENCE AND ELDER ABUSE SCREENING: "Did you fall because someone pushed you or tried to hurt you?" If Yes, ask: "Are you safe now?"     No 3. ONSET: "When did the fall happen?" (e.g., minutes, hours, or days ago)     Saturday AM 4. LOCATION: "What part of the body hit the ground?" (e.g., back, buttocks, head, hips, knees, hands, head, stomach)     Left hip, rib cage, left side 5. INJURY: "Did you hurt (injure) yourself when you fell?" If Yes, ask: "What did you injure? Tell me more about this?" (e.g., body area; type of injury; pain severity)"     Yes 6. PAIN: "Is there any pain?" If Yes, ask: "How bad is the pain?" (e.g., Scale 1-10; or mild,  moderate, severe)   - NONE (0): No pain   - MILD (1-3): Doesn't interfere with normal activities    - MODERATE (4-7): Interferes with normal activities or awakens from sleep    - SEVERE (8-10): Excruciating pain, unable to do any normal activities      5-6/10, worse with movement 7. SIZE: For cuts, bruises,  or swelling, ask: "How large is it?" (e.g., inches or centimeters)      Bruises 9. OTHER SYMPTOMS: "Do you have any other symptoms?" (e.g., dizziness, fever, weakness; new onset or worsening).      Rib cage misshapen. 10. CAUSE: "What do you think caused the fall (or falling)?" (e.g., tripped, dizzy spell)       Tripped  Protocols used: Falls and [facility]

## 2022-10-12 NOTE — Telephone Encounter (Signed)
Patient called regarding fall. Patient said he is doing better but would like a f/up with provider. Message sent to schedule to assist.

## 2022-10-13 NOTE — Progress Notes (Signed)
Erroneous encounter - disregard  Patient left without being seen.

## 2022-10-18 ENCOUNTER — Telehealth: Payer: Self-pay | Admitting: Family

## 2022-10-18 ENCOUNTER — Encounter: Payer: 59 | Admitting: Family

## 2022-10-18 ENCOUNTER — Encounter: Payer: Self-pay | Admitting: Family

## 2022-10-18 DIAGNOSIS — R21 Rash and other nonspecific skin eruption: Secondary | ICD-10-CM | POA: Diagnosis not present

## 2022-10-18 DIAGNOSIS — Z5321 Procedure and treatment not carried out due to patient leaving prior to being seen by health care provider: Secondary | ICD-10-CM

## 2022-10-18 DIAGNOSIS — L821 Other seborrheic keratosis: Secondary | ICD-10-CM | POA: Diagnosis not present

## 2022-10-18 NOTE — Telephone Encounter (Signed)
error 

## 2022-10-18 NOTE — Telephone Encounter (Signed)
Pt left without being seen by provider, pt angry said he had to wait to long and he has another appointment and provider didn't get to see pt.

## 2022-10-18 NOTE — Progress Notes (Signed)
-  Patient c/o left side/rib pain . - Patient fell on his cat x 9 days ago and upon falling on cat he also fell on a roller pad - Patient said pain is 3/10 with ibuprofen

## 2022-11-04 DIAGNOSIS — Z9089 Acquired absence of other organs: Secondary | ICD-10-CM | POA: Diagnosis not present

## 2022-11-24 DIAGNOSIS — L438 Other lichen planus: Secondary | ICD-10-CM | POA: Diagnosis not present

## 2022-11-24 DIAGNOSIS — L308 Other specified dermatitis: Secondary | ICD-10-CM | POA: Diagnosis not present

## 2023-03-30 ENCOUNTER — Other Ambulatory Visit: Payer: Self-pay | Admitting: Pharmacist

## 2023-03-30 NOTE — Progress Notes (Signed)
Pharmacy Quality Measure Review  This patient is appearing on report for being at risk of failing the measure for Statin Use in Persons with Diabetes (SUPD) medications this calendar year.   Prior trials of: atorvastatin  Pt has been identified by our quality measures as failing the above measure. It looks like he could not tolerate atorvastatin in the past. Will send staff message to pt's PCP with a list of exclusion codes to add if pt is intolerant of statins.   Butch Penny, PharmD, [place], CPP Clinical Pharmacist [facility] [phone]

## 2023-04-12 ENCOUNTER — Ambulatory Visit
Admission: RE | Admit: 2023-04-12 | Discharge: 2023-04-12 | Disposition: A | Discharge status: Home / Self Care | Payer: 59 | Source: Ambulatory Visit | Attending: Nurse Practitioner | Admitting: Nurse Practitioner

## 2023-04-12 ENCOUNTER — Other Ambulatory Visit: Payer: Self-pay | Admitting: Nurse Practitioner

## 2023-04-12 DIAGNOSIS — M549 Dorsalgia, unspecified: Secondary | ICD-10-CM

## 2023-04-12 DIAGNOSIS — M25561 Pain in right knee: Secondary | ICD-10-CM

## 2023-04-12 DIAGNOSIS — M545 Low back pain, unspecified: Secondary | ICD-10-CM

## 2023-04-12 DIAGNOSIS — M542 Cervicalgia: Secondary | ICD-10-CM

## 2023-04-12 DIAGNOSIS — R0781 Pleurodynia: Secondary | ICD-10-CM

## 2023-04-12 DIAGNOSIS — M25562 Pain in left knee: Secondary | ICD-10-CM

## 2023-09-20 ENCOUNTER — Other Ambulatory Visit: Payer: Self-pay | Admitting: Physician Assistant

## 2023-09-20 DIAGNOSIS — I825Z3 Chronic embolism and thrombosis of unspecified deep veins of distal lower extremity, bilateral: Secondary | ICD-10-CM

## 2023-09-21 ENCOUNTER — Inpatient Hospital Stay: Payer: 59 | Admitting: Physician Assistant

## 2023-09-21 ENCOUNTER — Telehealth: Payer: Self-pay

## 2023-09-21 ENCOUNTER — Inpatient Hospital Stay: Payer: 59

## 2023-09-21 NOTE — Telephone Encounter (Signed)
 T/C to pr regarding today's No Show appt.  He said he would not be returning to our office for any labs or OV.  He said he could not have surgery on his elbow because we said he had an infection and he did not have an infection. He still has not been able to have his surgery because of us  and he did not appreciate us  providing false information.  He is still taking Eliquis BID

## 2024-05-23 ENCOUNTER — Encounter: Payer: Self-pay | Admitting: Family

## 2024-05-23 ENCOUNTER — Ambulatory Visit: Admitting: Family

## 2024-05-23 VITALS — BP 120/76 | HR 78 | Temp 97.2°F | Ht 69.6 in | Wt 203.0 lb

## 2024-05-23 DIAGNOSIS — E1165 Type 2 diabetes mellitus with hyperglycemia: Secondary | ICD-10-CM | POA: Diagnosis not present

## 2024-05-23 DIAGNOSIS — T466X5A Adverse effect of antihyperlipidemic and antiarteriosclerotic drugs, initial encounter: Secondary | ICD-10-CM

## 2024-05-23 DIAGNOSIS — E785 Hyperlipidemia, unspecified: Secondary | ICD-10-CM

## 2024-05-23 DIAGNOSIS — M791 Myalgia, unspecified site: Secondary | ICD-10-CM

## 2024-05-23 DIAGNOSIS — M159 Polyosteoarthritis, unspecified: Secondary | ICD-10-CM

## 2024-05-23 DIAGNOSIS — Z86711 Personal history of pulmonary embolism: Secondary | ICD-10-CM | POA: Diagnosis not present

## 2024-05-23 DIAGNOSIS — E1169 Type 2 diabetes mellitus with other specified complication: Secondary | ICD-10-CM

## 2024-05-23 DIAGNOSIS — Z7984 Long term (current) use of oral hypoglycemic drugs: Secondary | ICD-10-CM

## 2024-05-23 NOTE — Patient Instructions (Addendum)
 Welcome to [facility], It was a pleasure meeting you today!   I have sent a referral to our nutrition office.  Please schedule a 2 month follow up visit today.    PLEASE NOTE: If you had any LAB tests please let us  know if you have not heard back within a few days. You may see your results on MyChart before we have a chance to review them but we will give you a call once they are reviewed by us . If we ordered any REFERRALS today, please let us  know if you have not heard from their office within the next week.  Let us  know through MyChart if you are needing REFILLS, or have your pharmacy send us  the request. You can also use MyChart to communicate with me or any office staff.  Please try these tips to maintain a healthy lifestyle: It is important that you exercise regularly at least 30 minutes 5 times a week. Think about what you will eat, plan ahead. Choose whole foods, & think  clean, green, fresh or frozen over canned, processed or packaged foods which are more sugary, salty, and fatty. 70 to 75% of food eaten should be fresh vegetables and protein. 2-3  meals daily with healthy snacks between meals, but must be whole fruit, protein or vegetables. Aim to eat over a 10 hour period when you are active, for example, 7am to 5pm, and then STOP after your last meal of the day, drinking only water.  Shorter eating windows, 6-8 hours, are showing benefits in heart disease and blood sugar regulation. Drink water every day! Shoot for 64 ounces daily = 8 cups, no other drink is as healthy! Fruit juice is best enjoyed in a healthy way, by EATING the fruit.

## 2024-05-23 NOTE — Progress Notes (Signed)
 New Patient Office Visit  Subjective:  Patient ID: Zohaib Heeney, male    DOB: May 11, 1956  Age: 68 y.o. MRN: [id number]  CC:  Chief Complaint  Patient presents with   New Patient (Initial Visit)   Diabetes   HPI Maurio Baize presents for establishing care today. Discussed the use of AI scribe software for clinical note transcription with the patient, who gave verbal consent to proceed.  History of Present Illness Chozen Nataniel Gasper is a 68 year old male who presents for insurance documentation and medication review.  He is transitioning from [employer] to [place] and needs updated documentation of his chronic conditions for insurance verification and medication review.  He has arthritis with bilateral knee replacements that limit daily activities and notes chronic spine problems. He manages diabetes with metformin  1000 mg twice daily. He has declined Mounjaro, insulin , and glipizide in past due to concerns about injections and personal preference. Home blood sugars are often above 200 mg/dL despite dietary efforts, with temporary improvement when he follows a strict carnivore diet. He had a pulmonary embolism that affected his lungs. 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 His total cholesterol has been 220-240 mg/dL. He is not on a statin due to prior adverse effects and does not want any other statin or other medication to improve his numbers. He has a persistent skin lesion on his inside elbow. A prior biopsy reportedly showed a wart, but he believes this is incorrect because the lesion seems migratory, with tiny pinpoint blisters popping up randomly. He uses hydrogen peroxide, which temporarily decreases its size. He tries to follow a low-carbohydrate, low-sugar diet with artificial sweeteners and has lost weight from 260 to 200 pounds, which he attributes to diet changes. He eats a heavy carnivore diet,  reports eating bread with fiber, 6 slices per day. He does not currently drink alcohol.  Assessment & Plan Type 2 diabetes mellitus with hyperglycemia Chronic type 2 diabetes with persistent hyperglycemia, blood glucose often >200 mg/dL. Limited success with dietary changes. Prefers diet management over medication, despite a reported A1C of 10 with last labs and the risk of organ failure discussed. - Continue metformin  1000 mg twice daily. - Referred to nutritionist for dietary management and education. - Scheduled follow-up appointment approximately 2 months before medication refills. - Requested records from previous provider for review.  Hyperlipidemia w/statin intolerance Chronic hyperlipidemia with total cholesterol 220-240 mg/dL. Muscle aches with previous statin use. Prefers to avoid statins. Discussed other statins with less SE and non statin options. Also discussed the increased risk of a negative cardiac event with high cholesterol and diabetes. Family history of hyperlipidemia. Prefers to treat with diet. - Advised on alternative meds to statins. - Advised on low saturated fat diet.  - Will continue to follow, recheck labs in 2-3 mos.  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   History of PE Previous pulmonary embolism with no significant carotid blockages. Aware of risk factors. - Continue taking Eliquis  bid, call office when refill needed.  Subjective:    Outpatient Medications Prior to Visit  Medication Sig Dispense Refill   apixaban  (ELIQUIS ) 2.5 MG TABS tablet Take 1 tablet (2.5 mg total) by mouth 2 (two) times daily. 60 tablet 6   augmented betamethasone dipropionate (DIPROLENE-AF) 0.05 % cream Apply topically.     b complex vitamins capsule Take 1 capsule by mouth daily.     Blood  Glucose Monitoring Suppl (TRUE METRIX METER) w/Device KIT Use as directed 1 kit 0   glucose blood (TRUE METRIX BLOOD GLUCOSE TEST) test strip Use as instructed  100 each 12   ibuprofen  (ADVIL ) 600 MG tablet Take 600 mg by mouth every 6 (six) hours as needed.     Magnesium  400 MG CAPS Take by mouth.     metFORMIN  (GLUCOPHAGE ) 1000 MG tablet Take 1,000 mg by mouth 2 (two) times daily.     metFORMIN  (GLUCOPHAGE ) 500 MG tablet TAKE 1 TABLET BY MOUTH 2 TIMES DAILY WITH A MEAL. 180 tablet 0   Multiple Vitamin (MULTIVITAMIN WITH MINERALS) TABS tablet Take 1 tablet by mouth daily.     ofloxacin  (FLOXIN ) 0.3 % OTIC solution Place 10 drops into the left ear daily. 5 mL 0   omeprazole  (PRILOSEC ) 20 MG capsule 1 cap(s) orally once a day; Duration: 30 day(s)     POTASSIUM CHLORIDE PO Take 1,000 mg by mouth.     triamcinolone  ointment (KENALOG ) 0.5 % Apply 1 application topically 2 (two) times daily. 80 g 0   TRUEplus Lancets 28G MISC Use as directed 100 each 4   MOUNJARO 2.5 MG/0.5ML Pen Inject 2.5 mg into the skin once a week. (Patient not taking: Reported on 05/23/2024)     No facility-administered medications prior to visit.   Past Medical History:  Diagnosis Date   History of left mastoidectomy 11/04/2022   History of pulmonary embolus (PE)    SBO (small bowel obstruction) (HCC) 05/2018   Past Surgical History:  Procedure Laterality Date   TONSILLECTOMY     TOTAL KNEE ARTHROPLASTY Right 2012    Objective:   Today's Vitals: BP 120/76 (BP Location: Left Arm, Patient Position: Sitting, Cuff Size: Large)   Pulse 78   Temp (!) 97.2 F (36.2 C) (Temporal)   Ht 5' 9.6 (1.768 m)   Wt 203 lb (92.1 kg)   SpO2 98%   BMI 29.46 kg/m   Physical Exam Vitals and nursing note reviewed.  Constitutional:      General: He is not in acute distress.    Appearance: Normal appearance.  HENT:     Head: Normocephalic.  Cardiovascular:     Rate and Rhythm: Normal rate and regular rhythm.  Pulmonary:     Effort: Pulmonary effort is normal.     Breath sounds: Normal breath sounds.  Musculoskeletal:        General: Normal range of motion.     Cervical back:  Normal range of motion.  Skin:    General: Skin is warm and dry.  Neurological:     Mental Status: He is alert and oriented to person, place, and time.  Psychiatric:        Mood and Affect: Mood normal.     No orders of the defined types were placed in this encounter.   Lucius Krabbe, NP

## 2024-06-01 ENCOUNTER — Telehealth: Payer: Self-pay

## 2024-06-01 NOTE — Telephone Encounter (Signed)
 Copied from CRM [id number]. Topic: General - Other >> Jun 01, 2024 12:18 PM Alfonso HERO wrote: Reason for CRM: [employer] faxed over form to enroll patient in their chronic condition program. Patient is calling wanting to see about expediting the form being sent back. Also asking for a call back.  Have we received this ?

## 2024-06-04 ENCOUNTER — Telehealth: Payer: Self-pay | Admitting: Family

## 2024-06-04 ENCOUNTER — Telehealth: Payer: Self-pay

## 2024-06-04 NOTE — Telephone Encounter (Signed)
 All of faxes from this time has been cleared. I do not recall seeing this form. Please have them re-fax.

## 2024-06-04 NOTE — Telephone Encounter (Signed)
 Patient dropped off document chronic disease checklist, to be filled out by provider. Patient requested to send it back via Fax within ASAP. Document is located in providers tray at front office.Please advise at [phone].

## 2024-06-04 NOTE — Telephone Encounter (Signed)
 Form completed and faxed.

## 2024-06-04 NOTE — Telephone Encounter (Signed)
 Received

## 2024-06-04 NOTE — Telephone Encounter (Signed)
 I reached out to patient and left a voicemail to inform him this form has not been received and will need to be faxed over to our office again at the fax number listed below.  [phone]

## 2024-06-04 NOTE — Telephone Encounter (Signed)
 Copied from CRM [id number]. Topic: General - Other >> Jun 04, 2024 11:45 AM Delon DASEN wrote: Reason for CRM: still waiting to get paperwork filled out and sent by Wednesday of this week, has been waiting almost 2 months- [facility] has been calling about this as well- [phone]  Form completed and Faxed.

## 2024-06-29 ENCOUNTER — Ambulatory Visit: Admitting: Family

## 2024-07-11 ENCOUNTER — Ambulatory Visit: Admitting: Family

## 2024-07-12 ENCOUNTER — Ambulatory Visit

## 2024-07-25 ENCOUNTER — Ambulatory Visit: Admitting: Family
